# Patient Record
Sex: Female | Born: 1950 | Race: White | Hispanic: No | Marital: Married | Smoking: Former smoker
Health system: Southern US, Community
[De-identification: ages and names within clinical notes are randomized; demographics above are authoritative.]

## PROBLEM LIST (undated history)

## (undated) DIAGNOSIS — K429 Umbilical hernia without obstruction or gangrene: Secondary | ICD-10-CM

## (undated) DIAGNOSIS — I10 Essential (primary) hypertension: Secondary | ICD-10-CM

## (undated) DIAGNOSIS — E785 Hyperlipidemia, unspecified: Secondary | ICD-10-CM

## (undated) DIAGNOSIS — H6690 Otitis media, unspecified, unspecified ear: Secondary | ICD-10-CM

## (undated) DIAGNOSIS — O009 Unspecified ectopic pregnancy without intrauterine pregnancy: Secondary | ICD-10-CM

## (undated) DIAGNOSIS — F32A Depression, unspecified: Secondary | ICD-10-CM

## (undated) DIAGNOSIS — F329 Major depressive disorder, single episode, unspecified: Secondary | ICD-10-CM

## (undated) DIAGNOSIS — Z9289 Personal history of other medical treatment: Secondary | ICD-10-CM

## (undated) DIAGNOSIS — M1990 Unspecified osteoarthritis, unspecified site: Secondary | ICD-10-CM

## (undated) DIAGNOSIS — R079 Chest pain, unspecified: Secondary | ICD-10-CM

## (undated) DIAGNOSIS — R001 Bradycardia, unspecified: Secondary | ICD-10-CM

## (undated) DIAGNOSIS — F419 Anxiety disorder, unspecified: Secondary | ICD-10-CM

## (undated) DIAGNOSIS — R42 Dizziness and giddiness: Secondary | ICD-10-CM

## (undated) DIAGNOSIS — R0600 Dyspnea, unspecified: Secondary | ICD-10-CM

## (undated) HISTORY — PX: ABDOMINAL HYSTERECTOMY: SHX81

## (undated) HISTORY — DX: Chest pain, unspecified: R07.9

## (undated) HISTORY — PX: BREAST SURGERY: SHX581

## (undated) HISTORY — DX: Dizziness and giddiness: R42

## (undated) HISTORY — PX: AUGMENTATION MAMMAPLASTY: SUR837

## (undated) HISTORY — DX: Bradycardia, unspecified: R00.1

## (undated) HISTORY — DX: Hyperlipidemia, unspecified: E78.5

## (undated) HISTORY — DX: Essential (primary) hypertension: I10

---

## 1979-04-05 DIAGNOSIS — Z9289 Personal history of other medical treatment: Secondary | ICD-10-CM

## 1979-04-05 HISTORY — DX: Personal history of other medical treatment: Z92.89

## 1999-01-06 ENCOUNTER — Other Ambulatory Visit: Admission: RE | Admit: 1999-01-06 | Discharge: 1999-01-06 | Payer: Self-pay | Admitting: Obstetrics and Gynecology

## 1999-01-29 ENCOUNTER — Encounter: Payer: Self-pay | Admitting: Internal Medicine

## 1999-01-29 ENCOUNTER — Ambulatory Visit (HOSPITAL_COMMUNITY): Admission: RE | Admit: 1999-01-29 | Discharge: 1999-01-29 | Payer: Self-pay | Admitting: Internal Medicine

## 2000-02-01 ENCOUNTER — Ambulatory Visit (HOSPITAL_COMMUNITY): Admission: RE | Admit: 2000-02-01 | Discharge: 2000-02-01 | Payer: Self-pay | Admitting: Internal Medicine

## 2000-02-01 ENCOUNTER — Encounter: Payer: Self-pay | Admitting: Internal Medicine

## 2000-02-02 ENCOUNTER — Other Ambulatory Visit: Admission: RE | Admit: 2000-02-02 | Discharge: 2000-02-02 | Payer: Self-pay | Admitting: Obstetrics and Gynecology

## 2000-04-04 HISTORY — PX: CHOLECYSTECTOMY: SHX55

## 2001-02-01 ENCOUNTER — Ambulatory Visit (HOSPITAL_COMMUNITY): Admission: RE | Admit: 2001-02-01 | Discharge: 2001-02-01 | Payer: Self-pay | Admitting: Internal Medicine

## 2001-02-01 ENCOUNTER — Encounter: Payer: Self-pay | Admitting: Internal Medicine

## 2001-02-09 ENCOUNTER — Encounter: Payer: Self-pay | Admitting: Internal Medicine

## 2001-02-09 ENCOUNTER — Encounter: Admission: RE | Admit: 2001-02-09 | Discharge: 2001-02-09 | Payer: Self-pay | Admitting: Internal Medicine

## 2001-07-19 ENCOUNTER — Other Ambulatory Visit: Admission: RE | Admit: 2001-07-19 | Discharge: 2001-07-19 | Payer: Self-pay | Admitting: Obstetrics and Gynecology

## 2002-02-12 ENCOUNTER — Ambulatory Visit (HOSPITAL_COMMUNITY): Admission: RE | Admit: 2002-02-12 | Discharge: 2002-02-12 | Payer: Self-pay | Admitting: Internal Medicine

## 2002-02-12 ENCOUNTER — Encounter: Payer: Self-pay | Admitting: Internal Medicine

## 2002-05-10 ENCOUNTER — Other Ambulatory Visit (HOSPITAL_COMMUNITY): Admission: RE | Admit: 2002-05-10 | Discharge: 2002-05-28 | Payer: Self-pay | Admitting: Psychiatry

## 2002-10-18 ENCOUNTER — Other Ambulatory Visit: Admission: RE | Admit: 2002-10-18 | Discharge: 2002-10-18 | Payer: Self-pay | Admitting: Obstetrics and Gynecology

## 2002-11-03 HISTORY — PX: OTHER SURGICAL HISTORY: SHX169

## 2002-11-03 HISTORY — PX: UPPER GASTROINTESTINAL ENDOSCOPY: SHX188

## 2002-11-25 ENCOUNTER — Encounter (INDEPENDENT_AMBULATORY_CARE_PROVIDER_SITE_OTHER): Payer: Self-pay | Admitting: *Deleted

## 2002-11-25 ENCOUNTER — Ambulatory Visit (HOSPITAL_COMMUNITY): Admission: RE | Admit: 2002-11-25 | Discharge: 2002-11-25 | Payer: Self-pay | Admitting: Gastroenterology

## 2003-07-08 ENCOUNTER — Encounter: Admission: RE | Admit: 2003-07-08 | Discharge: 2003-07-08 | Payer: Self-pay | Admitting: Internal Medicine

## 2004-07-08 ENCOUNTER — Encounter: Admission: RE | Admit: 2004-07-08 | Discharge: 2004-07-08 | Payer: Self-pay | Admitting: Internal Medicine

## 2004-07-28 ENCOUNTER — Encounter: Admission: RE | Admit: 2004-07-28 | Discharge: 2004-07-28 | Payer: Self-pay | Admitting: Internal Medicine

## 2004-08-02 HISTORY — PX: HEMORRHOID SURGERY: SHX153

## 2004-08-05 ENCOUNTER — Ambulatory Visit (HOSPITAL_COMMUNITY): Admission: RE | Admit: 2004-08-05 | Discharge: 2004-08-05 | Payer: Self-pay | Admitting: General Surgery

## 2004-08-05 ENCOUNTER — Ambulatory Visit (HOSPITAL_BASED_OUTPATIENT_CLINIC_OR_DEPARTMENT_OTHER): Admission: RE | Admit: 2004-08-05 | Discharge: 2004-08-05 | Payer: Self-pay | Admitting: General Surgery

## 2004-08-06 ENCOUNTER — Encounter (INDEPENDENT_AMBULATORY_CARE_PROVIDER_SITE_OTHER): Payer: Self-pay | Admitting: Specialist

## 2004-11-02 ENCOUNTER — Ambulatory Visit (HOSPITAL_COMMUNITY): Admission: RE | Admit: 2004-11-02 | Discharge: 2004-11-02 | Payer: Self-pay | Admitting: Obstetrics and Gynecology

## 2004-11-03 ENCOUNTER — Emergency Department (HOSPITAL_COMMUNITY): Admission: EM | Admit: 2004-11-03 | Discharge: 2004-11-03 | Payer: Self-pay | Admitting: Family Medicine

## 2004-11-05 ENCOUNTER — Ambulatory Visit: Payer: Self-pay | Admitting: Infectious Diseases

## 2004-11-19 ENCOUNTER — Ambulatory Visit: Payer: Self-pay | Admitting: Infectious Diseases

## 2005-02-07 ENCOUNTER — Encounter: Admission: RE | Admit: 2005-02-07 | Discharge: 2005-02-07 | Payer: Self-pay | Admitting: Internal Medicine

## 2005-10-10 ENCOUNTER — Encounter: Admission: RE | Admit: 2005-10-10 | Discharge: 2005-10-10 | Payer: Self-pay | Admitting: Internal Medicine

## 2006-03-06 ENCOUNTER — Emergency Department (HOSPITAL_COMMUNITY): Admission: EM | Admit: 2006-03-06 | Discharge: 2006-03-07 | Payer: Self-pay | Admitting: Emergency Medicine

## 2006-06-30 ENCOUNTER — Emergency Department (HOSPITAL_COMMUNITY): Admission: EM | Admit: 2006-06-30 | Discharge: 2006-07-01 | Payer: Self-pay | Admitting: Emergency Medicine

## 2006-07-01 ENCOUNTER — Ambulatory Visit: Payer: Self-pay | Admitting: Psychiatry

## 2006-07-01 ENCOUNTER — Inpatient Hospital Stay (HOSPITAL_COMMUNITY): Admission: EM | Admit: 2006-07-01 | Discharge: 2006-07-04 | Payer: Self-pay | Admitting: Psychiatry

## 2006-09-05 ENCOUNTER — Emergency Department (HOSPITAL_COMMUNITY): Admission: EM | Admit: 2006-09-05 | Discharge: 2006-09-06 | Payer: Self-pay | Admitting: Emergency Medicine

## 2006-11-29 ENCOUNTER — Encounter: Admission: RE | Admit: 2006-11-29 | Discharge: 2006-11-29 | Payer: Self-pay | Admitting: Obstetrics and Gynecology

## 2007-04-12 ENCOUNTER — Emergency Department (HOSPITAL_COMMUNITY): Admission: EM | Admit: 2007-04-12 | Discharge: 2007-04-12 | Payer: Self-pay | Admitting: Emergency Medicine

## 2007-06-20 ENCOUNTER — Emergency Department (HOSPITAL_COMMUNITY): Admission: EM | Admit: 2007-06-20 | Discharge: 2007-06-20 | Payer: Self-pay | Admitting: Emergency Medicine

## 2007-06-21 ENCOUNTER — Ambulatory Visit: Payer: Self-pay | Admitting: Psychiatry

## 2007-06-21 ENCOUNTER — Inpatient Hospital Stay (HOSPITAL_COMMUNITY): Admission: EM | Admit: 2007-06-21 | Discharge: 2007-06-26 | Payer: Self-pay | Admitting: Psychiatry

## 2007-09-03 HISTORY — PX: DILATION AND CURETTAGE OF UTERUS: SHX78

## 2007-09-27 ENCOUNTER — Encounter (INDEPENDENT_AMBULATORY_CARE_PROVIDER_SITE_OTHER): Payer: Self-pay | Admitting: Obstetrics and Gynecology

## 2007-09-27 ENCOUNTER — Ambulatory Visit (HOSPITAL_COMMUNITY): Admission: RE | Admit: 2007-09-27 | Discharge: 2007-09-27 | Payer: Self-pay | Admitting: Obstetrics and Gynecology

## 2007-10-03 ENCOUNTER — Emergency Department (HOSPITAL_COMMUNITY): Admission: EM | Admit: 2007-10-03 | Discharge: 2007-10-03 | Payer: Self-pay | Admitting: Emergency Medicine

## 2008-02-08 ENCOUNTER — Emergency Department (HOSPITAL_COMMUNITY): Admission: EM | Admit: 2008-02-08 | Discharge: 2008-02-08 | Payer: Self-pay | Admitting: Emergency Medicine

## 2008-02-27 ENCOUNTER — Ambulatory Visit (HOSPITAL_COMMUNITY): Admission: RE | Admit: 2008-02-27 | Discharge: 2008-02-27 | Payer: Self-pay | Admitting: Obstetrics and Gynecology

## 2008-09-10 ENCOUNTER — Emergency Department (HOSPITAL_BASED_OUTPATIENT_CLINIC_OR_DEPARTMENT_OTHER): Admission: EM | Admit: 2008-09-10 | Discharge: 2008-09-10 | Payer: Self-pay | Admitting: Emergency Medicine

## 2009-04-29 ENCOUNTER — Ambulatory Visit (HOSPITAL_COMMUNITY): Admission: RE | Admit: 2009-04-29 | Discharge: 2009-04-29 | Payer: Self-pay | Admitting: Obstetrics and Gynecology

## 2010-01-17 ENCOUNTER — Ambulatory Visit: Payer: Self-pay | Admitting: Psychiatry

## 2010-01-17 ENCOUNTER — Emergency Department (HOSPITAL_COMMUNITY)
Admission: EM | Admit: 2010-01-17 | Discharge: 2010-01-17 | Disposition: A | Discharge status: Other Acute Inpatient Hospital | Payer: Self-pay | Source: Home / Self Care | Admitting: Emergency Medicine

## 2010-01-17 ENCOUNTER — Inpatient Hospital Stay (HOSPITAL_COMMUNITY): Admission: RE | Admit: 2010-01-17 | Discharge: 2010-01-21 | Payer: Self-pay | Admitting: Psychiatry

## 2010-01-25 ENCOUNTER — Other Ambulatory Visit (HOSPITAL_COMMUNITY)
Admission: RE | Admit: 2010-01-25 | Discharge: 2010-03-18 | Payer: Self-pay | Source: Home / Self Care | Attending: Psychiatry | Admitting: Psychiatry

## 2010-02-24 ENCOUNTER — Ambulatory Visit: Payer: Self-pay | Admitting: Psychiatry

## 2010-03-05 ENCOUNTER — Emergency Department (HOSPITAL_COMMUNITY)
Admission: EM | Admit: 2010-03-05 | Discharge: 2010-03-06 | Payer: Self-pay | Source: Home / Self Care | Admitting: Emergency Medicine

## 2010-04-24 ENCOUNTER — Encounter: Payer: Self-pay | Admitting: Obstetrics and Gynecology

## 2010-04-25 ENCOUNTER — Encounter: Payer: Self-pay | Admitting: Internal Medicine

## 2010-04-25 ENCOUNTER — Encounter: Payer: Self-pay | Admitting: Obstetrics and Gynecology

## 2010-06-14 LAB — CBC
HCT: 39.6 % (ref 36.0–46.0)
Hemoglobin: 13.8 g/dL (ref 12.0–15.0)
MCH: 30.5 pg (ref 26.0–34.0)
MCHC: 34.8 g/dL (ref 30.0–36.0)
MCV: 87.4 fL (ref 78.0–100.0)
Platelets: 303 10*3/uL (ref 150–400)
RBC: 4.53 MIL/uL (ref 3.87–5.11)
RDW: 13.8 % (ref 11.5–15.5)
WBC: 5.8 10*3/uL (ref 4.0–10.5)

## 2010-06-14 LAB — GLUCOSE, CAPILLARY
Glucose-Capillary: 61 mg/dL — ABNORMAL LOW (ref 70–99)
Glucose-Capillary: 84 mg/dL (ref 70–99)

## 2010-06-14 LAB — BASIC METABOLIC PANEL
BUN: 8 mg/dL (ref 6–23)
CO2: 27 mEq/L (ref 19–32)
Calcium: 9.6 mg/dL (ref 8.4–10.5)
Chloride: 103 mEq/L (ref 96–112)
Creatinine, Ser: 0.65 mg/dL (ref 0.4–1.2)
GFR calc Af Amer: >60 mL/min (ref >60)
GFR calc non Af Amer: >60 mL/min (ref >60)
Glucose, Bld: 80 mg/dL (ref 70–99)
Potassium: 3.8 mEq/L (ref 3.5–5.1)
Sodium: 139 mEq/L (ref 135–145)

## 2010-06-14 LAB — ETHANOL
Alcohol, Ethyl (B): 238 mg/dL — ABNORMAL HIGH (ref 0–10)
Alcohol, Ethyl (B): 341 mg/dL — ABNORMAL HIGH (ref 0–10)

## 2010-06-15 LAB — URINE DRUGS OF ABUSE SCREEN W ALC, ROUTINE (REF LAB)
Amphetamine Screen, Ur: NEGATIVE
Amphetamine Screen, Ur: NEGATIVE
Amphetamine Screen, Ur: NEGATIVE
Amphetamine Screen, Ur: NEGATIVE
Barbiturate Quant, Ur: NEGATIVE
Barbiturate Quant, Ur: NEGATIVE
Barbiturate Quant, Ur: NEGATIVE
Barbiturate Quant, Ur: NEGATIVE
Benzodiazepines.: NEGATIVE
Benzodiazepines.: NEGATIVE
Benzodiazepines.: NEGATIVE
Benzodiazepines.: NEGATIVE
Cocaine Metabolites: NEGATIVE
Cocaine Metabolites: NEGATIVE
Cocaine Metabolites: NEGATIVE
Cocaine Metabolites: NEGATIVE
Creatinine,U: 11.6 mg/dL
Creatinine,U: 11.8 mg/dL
Creatinine,U: 23.4 mg/dL
Creatinine,U: 25.1 mg/dL
Ethyl Alcohol: <10 mg/dL (ref <10)
Ethyl Alcohol: <10 mg/dL (ref <10)
Ethyl Alcohol: <10 mg/dL (ref <10)
Ethyl Alcohol: <10 mg/dL (ref <10)
Marijuana Metabolite: NEGATIVE
Marijuana Metabolite: NEGATIVE
Marijuana Metabolite: NEGATIVE
Marijuana Metabolite: NEGATIVE
Methadone: NEGATIVE
Methadone: NEGATIVE
Methadone: NEGATIVE
Methadone: NEGATIVE
Opiate Screen, Urine: NEGATIVE
Opiate Screen, Urine: NEGATIVE
Opiate Screen, Urine: NEGATIVE
Opiate Screen, Urine: NEGATIVE
Phencyclidine (PCP): NEGATIVE
Phencyclidine (PCP): NEGATIVE
Phencyclidine (PCP): NEGATIVE
Phencyclidine (PCP): NEGATIVE
Propoxyphene: NEGATIVE
Propoxyphene: NEGATIVE
Propoxyphene: NEGATIVE
Propoxyphene: NEGATIVE

## 2010-06-16 LAB — URINE DRUGS OF ABUSE SCREEN W ALC, ROUTINE (REF LAB)
Amphetamine Screen, Ur: NEGATIVE
Barbiturate Quant, Ur: NEGATIVE
Benzodiazepines.: POSITIVE — AB
Cocaine Metabolites: NEGATIVE
Creatinine,U: 65.4 mg/dL
Ethyl Alcohol: <10 mg/dL (ref <10)
Marijuana Metabolite: NEGATIVE
Methadone: NEGATIVE
Opiate Screen, Urine: NEGATIVE
Phencyclidine (PCP): NEGATIVE
Propoxyphene: NEGATIVE

## 2010-06-16 LAB — CBC
HCT: 40.2 % (ref 36.0–46.0)
Hemoglobin: 14 g/dL (ref 12.0–15.0)
MCH: 30.9 pg (ref 26.0–34.0)
MCHC: 34.7 g/dL (ref 30.0–36.0)
MCV: 89.1 fL (ref 78.0–100.0)
Platelets: 248 10*3/uL (ref 150–400)
RBC: 4.51 MIL/uL (ref 3.87–5.11)
RDW: 15.3 % (ref 11.5–15.5)
WBC: 7.1 10*3/uL (ref 4.0–10.5)

## 2010-06-16 LAB — COMPREHENSIVE METABOLIC PANEL
ALT: 19 U/L (ref 0–35)
AST: 37 U/L (ref 0–37)
Albumin: 4.2 g/dL (ref 3.5–5.2)
Alkaline Phosphatase: 63 U/L (ref 39–117)
BUN: 6 mg/dL (ref 6–23)
CO2: 26 mEq/L (ref 19–32)
Calcium: 8.6 mg/dL (ref 8.4–10.5)
Chloride: 96 mEq/L (ref 96–112)
Creatinine, Ser: 0.48 mg/dL (ref 0.4–1.2)
GFR calc Af Amer: >60 mL/min (ref >60)
GFR calc non Af Amer: >60 mL/min (ref >60)
Glucose, Bld: 109 mg/dL — ABNORMAL HIGH (ref 70–99)
Potassium: 3.3 mEq/L — ABNORMAL LOW (ref 3.5–5.1)
Sodium: 135 mEq/L (ref 135–145)
Total Bilirubin: 0.6 mg/dL (ref 0.3–1.2)
Total Protein: 7.7 g/dL (ref 6.0–8.3)

## 2010-06-16 LAB — RAPID URINE DRUG SCREEN, HOSP PERFORMED
Amphetamines: NOT DETECTED
Barbiturates: NOT DETECTED
Benzodiazepines: NOT DETECTED
Cocaine: NOT DETECTED
Opiates: NOT DETECTED
Tetrahydrocannabinol: NOT DETECTED

## 2010-06-16 LAB — DIFFERENTIAL
Basophils Absolute: 0 10*3/uL (ref 0.0–0.1)
Basophils Relative: 1 % (ref 0–1)
Eosinophils Absolute: 0 10*3/uL (ref 0.0–0.7)
Eosinophils Relative: 0 % (ref 0–5)
Lymphocytes Relative: 13 % (ref 12–46)
Lymphs Abs: 0.9 10*3/uL (ref 0.7–4.0)
Monocytes Absolute: 0.9 10*3/uL (ref 0.1–1.0)
Monocytes Relative: 12 % (ref 3–12)
Neutro Abs: 5.2 10*3/uL (ref 1.7–7.7)
Neutrophils Relative %: 74 % (ref 43–77)

## 2010-06-16 LAB — BENZODIAZEPINE, QUANTITATIVE, URINE
Alprazolam (GC/LC/MS), ur confirm: NEGATIVE NG/ML
Flurazepam GC/MS Conf: NEGATIVE NG/ML
Lorazepam UR QT: NEGATIVE NG/ML
Nordiazepam GC/MS Conf: NEGATIVE NG/ML
Oxazepam GC/MS Conf: 584 NG/ML — ABNORMAL HIGH
Temazepam GC/MS Conf: NEGATIVE NG/ML

## 2010-06-16 LAB — ETHANOL: Alcohol, Ethyl (B): 149 mg/dL — ABNORMAL HIGH (ref 0–10)

## 2010-06-28 ENCOUNTER — Other Ambulatory Visit (HOSPITAL_COMMUNITY): Payer: Self-pay | Admitting: Family Medicine

## 2010-06-28 DIAGNOSIS — Z1231 Encounter for screening mammogram for malignant neoplasm of breast: Secondary | ICD-10-CM

## 2010-07-07 ENCOUNTER — Ambulatory Visit (HOSPITAL_COMMUNITY)
Admission: RE | Admit: 2010-07-07 | Discharge: 2010-07-07 | Disposition: A | Discharge status: Home / Self Care | Payer: BC Managed Care – PPO | Source: Ambulatory Visit | Attending: Family Medicine | Admitting: Family Medicine

## 2010-07-07 DIAGNOSIS — Z1231 Encounter for screening mammogram for malignant neoplasm of breast: Secondary | ICD-10-CM | POA: Insufficient documentation

## 2010-07-12 LAB — URINE MICROSCOPIC-ADD ON

## 2010-07-12 LAB — POCT TOXICOLOGY PANEL
Benzodiazepines: POSITIVE
Opiates: POSITIVE

## 2010-07-12 LAB — DIFFERENTIAL
Basophils Absolute: 0.1 10*3/uL (ref 0.0–0.1)
Basophils Relative: 1 % (ref 0–1)
Eosinophils Absolute: 0.1 10*3/uL (ref 0.0–0.7)
Eosinophils Relative: 1 % (ref 0–5)
Lymphocytes Relative: 16 % (ref 12–46)
Lymphs Abs: 2.1 10*3/uL (ref 0.7–4.0)
Monocytes Absolute: 0.8 10*3/uL (ref 0.1–1.0)
Monocytes Relative: 6 % (ref 3–12)
Neutro Abs: 9.7 10*3/uL — ABNORMAL HIGH (ref 1.7–7.7)
Neutrophils Relative %: 76 % (ref 43–77)

## 2010-07-12 LAB — HEPATIC FUNCTION PANEL
ALT: 23 U/L (ref 0–35)
AST: 52 U/L — ABNORMAL HIGH (ref 0–37)
Albumin: 4.7 g/dL (ref 3.5–5.2)
Alkaline Phosphatase: 102 U/L (ref 39–117)
Bilirubin, Direct: 0 mg/dL (ref 0.0–0.3)
Indirect Bilirubin: 0.3 mg/dL (ref 0.3–0.9)
Total Bilirubin: 0.3 mg/dL (ref 0.3–1.2)
Total Protein: 8.4 g/dL — ABNORMAL HIGH (ref 6.0–8.3)

## 2010-07-12 LAB — CBC
HCT: 42.1 % (ref 36.0–46.0)
Hemoglobin: 14.1 g/dL (ref 12.0–15.0)
MCHC: 33.5 g/dL (ref 30.0–36.0)
MCV: 88.3 fL (ref 78.0–100.0)
Platelets: 300 10*3/uL (ref 150–400)
RBC: 4.77 MIL/uL (ref 3.87–5.11)
RDW: 14.6 % (ref 11.5–15.5)
WBC: 12.8 10*3/uL — ABNORMAL HIGH (ref 4.0–10.5)

## 2010-07-12 LAB — URINALYSIS, ROUTINE W REFLEX MICROSCOPIC
Bilirubin Urine: NEGATIVE
Glucose, UA: NEGATIVE mg/dL
Hgb urine dipstick: NEGATIVE
Ketones, ur: NEGATIVE mg/dL
Leukocytes, UA: NEGATIVE
Nitrite: NEGATIVE
Protein, ur: 30 mg/dL — AB
Specific Gravity, Urine: 1.015 (ref 1.005–1.030)
Urobilinogen, UA: 0.2 mg/dL (ref 0.0–1.0)
pH: 5 (ref 5.0–8.0)

## 2010-07-12 LAB — BASIC METABOLIC PANEL
BUN: 10 mg/dL (ref 6–23)
CO2: 29 mEq/L (ref 19–32)
Calcium: 8.6 mg/dL (ref 8.4–10.5)
Chloride: 101 mEq/L (ref 96–112)
Creatinine, Ser: 0.6 mg/dL (ref 0.4–1.2)
GFR calc Af Amer: >60 mL/min (ref >60)
GFR calc non Af Amer: >60 mL/min (ref >60)
Glucose, Bld: 70 mg/dL (ref 70–99)
Potassium: 4.1 mEq/L (ref 3.5–5.1)
Sodium: 143 mEq/L (ref 135–145)

## 2010-07-12 LAB — ACETAMINOPHEN LEVEL: Acetaminophen (Tylenol), Serum: <10 ug/mL — ABNORMAL LOW (ref 10–30)

## 2010-07-12 LAB — ETHANOL: Alcohol, Ethyl (B): 158 mg/dL — ABNORMAL HIGH (ref 0–10)

## 2010-07-27 ENCOUNTER — Ambulatory Visit: Payer: BLUE CROSS/BLUE SHIELD | Admitting: Psychiatry

## 2010-08-17 NOTE — Consult Note (Signed)
Amanda [name], Amanda [name] NO.:  [id number]   MEDICAL RECORD NO.:  [id number]          PATIENT TYPE:  EMS   LOCATION:  ED                           FACILITY:  [facility]   PHYSICIAN:  Erasmo Leventhal, M.D.DATE OF BIRTH:  13-Jul-1950   DATE OF CONSULTATION:  04/12/2007  DATE OF DISCHARGE:                                 CONSULTATION   I was asked to see the patient by Ramiro Harvest, M.D. of [facility].   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  She complains of a four-week history of atraumatic right  lateral hip pain which has increased over the past two weeks since  taking her kids on a trip.  Last night she fell and injured her right  foot.  She presents to the emergency room today complaining of right  lateral hip pain with some spasm in the right calf which has just  developed and right foot pain since the fall.  She was initially seen  and was contemplated admission to the hospital by Dr. Janee Morn.  When I  evaluated the patient she had received narcotics and also anti-  inflammatory medications.  She was awake, alert, oriented to person,  place, time, and circumstance.  Very comfortable and wondered if she  could go home.  She states that the right lateral hip had been bothering  her for several weeks and had simply gotten worse over the past two  weeks.  She has had no back pain, no numbness or tingling, no fever,  chills, or sweats.  The right foot has been bothering her since her  fall.  She developed a little bit of calf spasm when she first came in,  but that had gone away by now.   ALLERGIES:  CODEINE produces nausea.   PAST MEDICAL HISTORY:  Significant for prior suicide attempt on July 01, 2006, depressive NOS, ethanol dependence, hypertension, prolapsed  thrombosed hemorrhoids status post surgery by Anselm Pancoast. Zachery Dakins, M.D.  in May of 2006, chronic gastritis, history of superficial ulcer  in  antrum in 2004, no bleed or problem since then.  Status post  cholecystectomy in 2002.  Status post ruptured ectopic pregnancy.  Status post bilateral saline implant augmentation and hyperlipidemia.   MEDICATIONS:  1. Hydrochlorothiazide.  2. Aspirin.  3. Cymbalta.  4. Multivitamin.  5. Calcium.   SOCIAL HISTORY:  She is married for 34 years.  She is an Char[employer] fundraiser.  She is  unemployed.  She lives in [place].  Tobacco 4-5 per day and ethanol 3-  4 times per week.  No IV drug abuse.   FAMILY HISTORY:  Noncontributory.   REVIEW OF SYSTEMS:  MUSCULOSKELETAL:  Otherwise negative.   PHYSICAL EXAMINATION:  GENERAL:  Well-developed, well-nourished female  in no acute distress.  Very comfortable.  VITAL SIGNS:  Temperature 97.7.  EXTREMITIES:  Bilateral upper extremities unremarkable except for a  small amount of ecchymosis.  Lumbar spine unremarkable.  Lower  extremities; leg lengths are equal.  Right hip; full range of motion  actively and passively.  Pain with maximum adduction.  Point tenderness  of the greater trochanter reproducing her pain.  IT band is somewhat  tight.  Thigh, knee, calf unremarkable.  Compartments are soft.  Calf  not swollen and nontender, negative Homan's.  Unremarkable examination.  Right foot; mild swelling of the mid foot region.  Neurovascular  examination intact.  Compartments are soft.  No evidence of instability.  Ankle unremarkable.   Plain x-rays of the AP pelvis and hip negative.  Right knee negative.  Right foot; first metatarsal fracture, CT scan confirmed isolated  metatarsal fracture and no evidence of Lisfranc injury.   I evaluated the patient and felt that she had trochanteric bursitis.  I  discussed the risks and benefits of injection and she asked me to  proceed.   DESCRIPTION OF PROCEDURE:  The hip was cleansed with alcohol and I  injected the trochanteric bursa with 40 mg of Kenalog, 5 mL 1%  lidocaine, 5 mL 0.5% Marcaine without  complications or problems.  Following this, she had 100% resolution of her hip pain.   IMPRESSION:  1. Right lateral hip pain, trochanteric bursitis by history, physical      examination, and injection. Recommendations; Injection as above,      ice, Medrol Dosepak for six days.  2. Right foot first metatarsal isolated fracture.  Recommendations;      Ace wrap, Cam walker when out of bed, weightbearing as tolerated      with crutches as necessary.  Percocet and Robaxin was dispensed for      pain and for spasm.   The patient has been asking if she can go home.  I think that is  appropriate.  I discussed it with Dr. Janee Morn and he agrees.  I also  did this in the presence of a chaperone by her nurse, Sherlyn Lick.  With  that in mind the patient will be discharged home with instructions to  ice the hip and the foot, use crutches as necessary, and Cam walker when  out of bed.  She will take the medications as noted above.  She will be  seen back in my office in 1-2 weeks for follow-up.  All questions were  encouraged and answered in detail.           ______________________________  Erasmo Leventhal, M.D.     RAC/MEDQ  D:  04/12/2007  T:  04/13/2007  Job:  [id number]   cc:   Ramiro Harvest, MD
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
## 2010-08-17 NOTE — H&P (Signed)
NAMEASHLYN, Amanda [name] NO.:  [id number]   MEDICAL RECORD NO.:  [id number]          PATIENT TYPE:  EMS   LOCATION:  ED                           FACILITY:  [facility]   PHYSICIAN:  Ramiro Harvest, MD    DATE OF BIRTH:  11-20-1950   DATE OF ADMISSION:  DATE OF DISCHARGE:                              HISTORY & PHYSICAL   ADDENDUM   This is a addendum to job number [id number].   The patient was seen by orthopedics, Dr. Thomasena Edis, who assessed the  patient for a right foot fracture and right hip pain.  Felt the  patient's hip pain and muscle spasms likely secondary to a trochanteric  bursitis.  Dr. Thomasena Edis is going to go ahead and give the patient a  steroid injection here in the emergency room, place the patient on a  Medrol Dosepak and have the patient follow up with him as an outpatient  in  a week.  Foot fracture per ortho who he will place the patient in a  boot and have her follow up as a outpatient.  Feels that the patient  does want to go home and most of her symptoms are related to this  trochanteric bursitis which the patient will be managed as an outpatient  with Dr. Thomasena Edis and as such, the patient will be discharged home from  the emergency room     It  has been a pleasure taking care of Amanda [name].      Ramiro Harvest, MD  Electronically Signed     DT/MEDQ  D:  04/12/2007  T:  04/13/2007  Job:  [id number]   cc:   Dario Guardian, M.D.  Fax: [phone]

## 2010-08-17 NOTE — H&P (Signed)
Amanda, [name]                 ACCOUNT NO.:  [id number]   MEDICAL RECORD NO.:  [id number]          PATIENT TYPE:  INP   LOCATION:  0110                         FACILITY:  [facility]   PHYSICIAN:  Ramiro Harvest, MD    DATE OF BIRTH:  02-04-1951   DATE OF ADMISSION:  04/12/2007  DATE OF DISCHARGE:                              HISTORY & PHYSICAL   PRIMARY CARE PHYSICIAN:  Dario Guardian, M.D.   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  The patient stated that last night, one night  prior to admission, while she was coming down the stairs she heard a  popping sound and her leg gave away and she fell on the right side.  The  patient complained of right hip and right foot pain.  The patient also  complained of intermittent muscular spasms in the right lower extremity  from the right calf to the thigh that usually lasts seconds, is sharp in  nature.  No swelling or erythema.  No fever no chills.  No vomiting.  No  recent long flights.  No immobility.  The patient does state that has  had diarrhea for the past 2 weeks.  No recent antibiotics.  Good p.o.  intake.  No other associated symptoms.  In the ED the patient was given  morphine, Compazine, Valium, Dilaudid, Toradol some improvement per  patient.  Plain films of the right hip were negative.  Plain films of  the right foot showed a first base metatarsal fracture.  Chest x-ray  showed a new right upper lobe nodule, otherwise negative.  We were  called to admit the patient for further evaluation and management.   ALLERGIES:  CODEINE causes nausea.   PAST MEDICAL HISTORY:  1. A prior suicide attempt March 2008.  2. Depressive disorder.  3. Alcohol dependence.  4. Hypertension.  5. Prolapsed, thrombosed internal-external hemorrhoids, status post   hemorrhoidectomy per Dr. Zachery Dakins Aug 09, 2004.  6. Chronic gastritis.  7. History of superficial ulcer in the antrum per EGD of August 2004.  8. Status post cholecystectomy in 2002.  9. Status post ruptured ectopic pregnancy.  10.Bilateral saline implant augmentation.  11.Hyperlipidemia.   HOME MEDICATIONS:  1. Hydrochlorothiazide 25 mg p.o. daily.  2. Aspirin 81 mg p.o. daily.  3. Cymbalta 60 mg p.o. daily.  4. A multivitamin with calcium 600 mg daily.   SOCIAL HISTORY:  The patient has been married for 34 years.  Is a  Designer, jewellery, currently unemployed.  Lives in [place].  Positive  tobacco history.  Positive alcohol use.  No IV drug use.   FAMILY HISTORY:  Mother alive, age 54, with Crohn disease,  osteoarthritis.  Father deceased, age 23, with an acute MI.  The patient  has one brother and one sister with family history of hypertension and  hyperlipidemia.   REVIEW OF SYSTEMS:  Positive for diarrhea for the past  2 weeks and as  per HPI, otherwise negative.   VITAL SIGNS:  Temperature 97.7, BP 146/90, pulse of 92, respiratory rate  16, saturating 95% on room air.  GENERAL:  The patient is lying on a gurney with her husband by the  bedside, no apparent distress.  HEENT:  Normocephalic, atraumatic.  Pupils equal, round, reactive to  light.  Extraocular movements intact.  Oropharynx clear, moist, no  lesions.  NECK:  Supple.  No lymphadenopathy.  RESPIRATORY:  Lungs are clear to auscultation bilaterally.  No wheezes,  no rhonchi.  No crackles.  CARDIOVASCULAR:  Regular rate and rhythm.  No murmurs, rubs or gallops.  ABDOMEN:  Soft, nontender, nondistended.  Positive bowel sounds.  EXTREMITIES:  No clubbing, cyanosis or edema.  Negative Homans' sign.  Lower extremities nonerythematous, nonedematous.  Normal range of motion  of the right hip.  NEUROLOGICAL:  The patient is alert and oriented x3.  Cranial nerves II-  XII grossly intact.  No focal deficit.   LABS:   Magnesium 2.1.  Sodium 137, potassium 3.7, chloride 100, bicarb  25, BUN 7, creatinine 0.53, glucose 93, calcium 8.9.  CBC:  White count  10.6, hemoglobin 14.6, hematocrit 40.9, platelets 360.  Plain films of  the right foot:  Fracture at the base of first metatarsal.  Chest x-ray:  Cannot rule out a new right upper lobe nodule, recommend PA and lateral  chest x-ray when possible.  Right hip films  no acute findings.  Right  knee x-ray:  No acute abnormality.   ASSESSMENT AND PLAN:  Amanda [name] is a 60 year old female with history  of depression, hypertension and alcohol dependence in the past, who  presented with a 2-week history of worsening right hip and muscular  spasm in the right calf shooting to the right thigh.   1. Right hip pain, questionable etiology.  Plain films negative for      fracture.  The patient with normal range of motion.  We will check      an MRI of the right hip to rule out a fracture, make sure the      patient does not have osteonecrosis or other pathology causing this      right hip pain, and pain management.  2. Muscle spasm, questionable etiology.  Differential includes      electrolyte imbalance (however, the patient's potassium is normal,      Magnesium is normal, Calcium is normal) versus dehydration, The      patient with a 2-week history of diarrhea, versus a rhabdomyolysis,      versus deep vein thrombosis, which is unlikely with low pretest      probability.  We will check a total creatinine kinase, hydrate with      IV fluids, and follow.  May also be secondary to foot fracture.  3. Right first base metatarsal fracture per emergency department.      Emergency department physician states that he was called also for      consult.  Patient with a boot on.  Pain management, and will defer      further recommendations per orthopedics.  4. Diarrhea.  Check stool cultures for enteric pathogens, Escherichia      coli, Clostridium difficile toxin, fecal  leukocytes, IV fluid      hydration, and will follow.  5. Leukocytosis, likely secondary to problem #3.  We will follow.  No      need for antibiotics at this time/  6.  Hypertension.  Hydrochlorothiazide 25 mg daily.  7. Depression.  Cymbalta 60 mg daily.  8. Hyperlipidemia.  9. History of alcohol dependence.  Monitor for DTs.  10.Prophylaxis:  Protonix for GI, Lovenox for DVT prophylaxis.      Ramiro Harvest, MD  Electronically Signed     DT/MEDQ  D:  04/12/2007  T:  04/13/2007  Job:  [id number]   cc:   Dario Guardian, M.D.  Fax: [phone]

## 2010-08-17 NOTE — Op Note (Signed)
NAMECAROLANNE, Amanda [name]                 ACCOUNT NO.:  [id number]   MEDICAL RECORD NO.:  [id number]          PATIENT TYPE:  AMB   LOCATION:  SDC                           FACILITY:  [facility]   PHYSICIAN:  Maxie Better, M.D.DATE OF BIRTH:  1950-04-22   DATE OF PROCEDURE:  DATE OF DISCHARGE:                               OPERATIVE REPORT   PREOPERATIVE DIAGNOSES:  Postmenopausal bleeding, endometrial mass.   POSTOPERATIVE DIAGNOSIS:  Postmenopausal bleeding,  submucosal fibroid.   PROCEDURE:  Diagnostic hysteroscopy, hysteroscopic resection of  submucosal fibroid, and D&C.   ANESTHESIA:  MAC paracervical block.   SURGEON:  Maxie Better, MD   PROCEDURE:  Under adequate anesthesia, the patient was placed in the  dorsal lithotomy position.  She was sterilely prepped and draped in the  usual fashion.  She was catheterized for moderate amount of urine.  Examination under anesthesia revealed a normal-sized anteverted uterus.  No adnexal masses were appreciated.  Bivalve speculum was placed in the  vagina.  A 20 mL of 1% Nesacaine injected paracervical at 3 and 9  o'clock.  The anterior lip of the cervix was grasped with a single-tooth  tenaculum.  Cervix was dilated up to #23  [facility] dilator.  A sorbitol  primed diagnostic hysteroscope was introduced into the uterine cavity.  Both tubal ostia were seen well.  The endocervical canal was without any  lesions.  There was a submucosal fibroid anteriorly.  The hysteroscope  was removed.  The cavity was further dilated up to #27 [facility] dilator.  A  resectoscope was then inserted.  The fibroid was resected in total.  The  resectoscope removed.  The cavity was curetted for scant amount of  tissue.  The procedure was then felt to have been complete, at which  time all instruments were then removed from the vagina.  Specimen  labeled endometrial curetting and fibroid resection was sent to  pathology.  Estimated blood loss was minimal.   Complication was none.  The patient tolerated the procedure well and was transferred to recovery  room in stable condition.      Maxie Better, M.D.  Electronically Signed     [place]/MEDQ  D:  09/27/2007  T:  09/28/2007  Job:  [id number]

## 2010-08-18 ENCOUNTER — Ambulatory Visit: Payer: BLUE CROSS/BLUE SHIELD | Admitting: Psychiatry

## 2010-08-20 NOTE — Op Note (Signed)
   NAME:  Amanda [name], Amanda [name]                           ACCOUNT NO.:  [id number]   MEDICAL RECORD NO.:  [id number]                   PATIENT TYPE:  AMB   LOCATION:  ENDO                                 FACILITY:  [facility]   PHYSICIAN:  Anselmo Rod, M.D.               DATE OF BIRTH:  [birth year]   DATE OF PROCEDURE:  11/25/2002  DATE OF DISCHARGE:                                 OPERATIVE REPORT   PROCEDURE:  Colonoscopy.   ENDOSCOPIST:  Charna Elizabeth, M.D.   INSTRUMENT USED:  Olympus video colonoscope.   INDICATIONS FOR PROCEDURE:  Rectal bleeding in a 60 year old white female.  Rule out colonic polyps, masses, etc.   PREPROCEDURE PREPARATION:  Informed consent was procured from the patient.  The patient was fasted for eight hours prior to the procedure and prepped  with a bottle of magnesium citrate and a gallon of GOLYTELY the night prior  to the procedure.   PREPROCEDURE PHYSICAL EXAMINATION:  VITAL SIGNS:  The patient had stable  vital signs.  NECK:  Supple.  CHEST:  Clear to auscultation.  HEART:  S1 and S2 regular.  ABDOMEN:  Soft with normal bowel sounds.   DESCRIPTION OF PROCEDURE:  The patient was placed in the left lateral  decubitus position, sedated with 100 mg of Demerol and 10 mg of Versed  intravenously.  Once the patient was adequately sedated and maintained on  low flow oxygen and continuous cardiac monitoring, the Olympus video  colonoscope was advanced from the rectum to the cecum and terminal ileum  without difficulty.  The prepyloric orifice and ileocecal valve were clearly  visualized and photographed.  The terminal ileum appeared normal.  No  masses, polyps, erosions, ulcerations or diverticula were seen.  The patient  had prominent internal hemorrhoids and small external hemorrhoids.  I  suspect the patient's rectal bleeding is probably from the internal  hemorrhoids.  The patient tolerated the procedure well without  complications.   IMPRESSION:  Normal  colonoscopy up to the  terminal ileum except for  prominent internal hemorrhoids and small external hemorrhoids.    RECOMMENDATIONS:  1. Trial of Anusol suppositories, 2.5% one per rectum q.h.s. has been     advised.  2. High fiber diet with liberal fluid intake.  3. Proceed with an EGD at this time.                                               Anselmo Rod, M.D.    JNM/MEDQ  D:  11/25/2002  T:  11/26/2002  Job:  [id number]   cc:   Maxie Better, M.D.  [address]  [address]  [place]  [place] [address]  Fax: [phone]
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
## 2010-08-20 NOTE — Op Note (Signed)
[name], [name]                 ACCOUNT NO.:  [id number]   MEDICAL RECORD NO.:  [id number]          PATIENT TYPE:  AMB   LOCATION:  NESC                         FACILITY:  [facility]   PHYSICIAN:  Amanda [name], M.D.DATE OF BIRTH:  1950-08-05   DATE OF PROCEDURE:  08/05/2004  DATE OF DISCHARGE:                                 OPERATIVE REPORT   PREOPERATIVE DIAGNOSIS:  Prolapsed, thrombosed internal/external  hemorrhoids.   OPERATION:  Internal/external hemorrhoidectomy, general anesthesia,  lithotomy position.   SURGEON:  Amanda [name], M.D.   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  I saw her yesterday  afternoon, and she had a very large, prolapsed internal hemorrhoid complex  on the left with other thrombosed hemorrhoids circumferentially.  Reduced  the area with Xylocaine block and recommended that if she was not  significantly better today, that we could go ahead and proceed with an  urgent hemorrhoidectomy.  She said that she has had problems with  hemorrhoids ever since her second delivery, about 22 years ago, and has  never had a colonoscopy, I do not think.   On examination, she has prolapsed hemorrhoids.  Circumferentially, the  largest complex is on the left lateral side.  She was given 3 gm of Unasyn  and positioned on the OR table and placed up in a lithotomy position after  general endotracheal tube anesthesia.  The area was prepped with Betadine  surgical solution and draped in a sterile manner.  At first, I anesthetized  the sphincter area with about 20 cc of 0.5% Marcaine with Adrenaline and  then elected to go ahead and remove the right anterior quadrant first.  It  was the smaller of the three, elevating and removing the thrombus from the  external hemorrhoids and then elevating the hemorrhoid complex  with exposure  of the sphincter and then under-clamping it with a Buie clamp.  Used a  couple of U stitches of 2-0 chromic and then sutured over the Buie clamp and  then pulled the sutures snug and then closed the external portion of the  hemorrhoidectomy incision.  Next, the right posterolateral quadrant, which  was the second largest, was removed, and then I removed also some little  hemorrhoids in the inferior anal area by elevating the skin and mucosa.  I  closed this with a similar manner of about two U stitches of 2-0 chromic,  and I did place a couple of figure-of-eights of 3-0 chromic for a little  hemostasis in the mid portion of the incision.  Next, the largest in the  prolapsed, thrombosed area was then elevated from the left lateral position,  and the hemorrhoid complex was under-clamped with a Buie.  The sphincter was  under direct vision and protected, and then the couple of U stitches of 2-0  chromic and then suturing over this clamp, removing the clamp and then  closing the external components.  I  left a little bit of externally  thrombosed, superficial hemorrhoids in the left anterior area, but I elected  not to open this up, I do not want to make a fourth incision.  The procedure  was tolerated nicely.  I inspected all suture lines with an anoscope and put  Nupercaine in the anal canal with a gauze kind of within the anal canal for  hemostasis immediately postoperatively.  She will be released after a  short stay in the recovery room and hopes to be back to work in  approximately a week.  Hopefully, she will not have too much pain and will  start soaking in the bathtub and passing her urine, hopefully shortly after  the actual anesthetic agent has been absorbed.      WJW/MEDQ  D:  08/05/2004  T:  08/05/2004  Job:  [id number]

## 2010-08-20 NOTE — Op Note (Signed)
NAME:  Amanda [name], Amanda [name]                           ACCOUNT NO.:  [id number]   MEDICAL RECORD NO.:  [id number]                   PATIENT TYPE:  AMB   LOCATION:  ENDO                                 FACILITY:  [facility]   PHYSICIAN:  Anselmo Rod, M.D.               DATE OF BIRTH:  Nov 02, 1950   DATE OF PROCEDURE:  11/25/2002  DATE OF DISCHARGE:                                 OPERATIVE REPORT   PROCEDURE PERFORMED:  Esophagogastroduodenoscopy.   ENDOSCOPIST:  Charna Elizabeth, M.D.   INSTRUMENT USED:  Olympus video panendoscope.   INDICATIONS FOR PROCEDURE:  History of reflux, regurgitation of foods and  epigastric pain in a 60 year old white female rule out peptic ulcer disease,  esophagitis, gastritis, etc.   PREPROCEDURE PREPARATION:  Informed consent was procured from the patient.  The patient was fasted for eight hours prior to the procedure.   PREPROCEDURE PHYSICAL:  The patient had stable vital signs.  Neck supple,  chest clear to auscultation.  S1, S2 regular.  Abdomen soft with normal  bowel sounds.   DESCRIPTION OF PROCEDURE:  The patient was placed in the left lateral  decubitus position and sedated with Demerol and Versed for the colonoscopy.  She received additional sedation for the EGD.  Once the patient was  adequately sedated and maintained on low-flow oxygen and continuous cardiac  monitoring, the Olympus video panendoscope was advanced through the mouth  piece over the tongue into the esophagus under direct vision.  The entire  esophagus appeared normal with no evidence of ring, stricture, masses,  esophagitis or Barrett's mucosa.  The scope was then advanced to the  stomach.  There was a superficial ulcer seen in the antrum. There was no  evidence of a visible vessel.  Biopsies were done to rule out presence of  Helicobacter pylori by pathology.  There was evidence of chronic gastritis  throughout the gastric mucosa.  No other ulcers, erosions or masses were  seen.   The duodenal bulb and the proximal small bowel distal to the bulb  appeared normal.  There was no outlet obstruction.  The patient tolerated  the procedure well without complications.   IMPRESSION:  1. Superificial ulcer in the antrum without visible vessel.  2. Chronic gastritis throughout the gastric mucosa.  3. Normal-appearing esophagus and proximal small bowel.    RECOMMENDATIONS:  1. Trial of proton pump inhibitor will be suggested.  2. Treat with antibiotics if Helicobacter pylori present.  3. Outpatient follow-up in the next two weeks for further recommendations.                                                Anselmo Rod, M.D.    JNM/MEDQ  D:  11/25/2002  T:  11/26/2002  Job:  [id number]   cc:   Maxie Better, M.D.  [address]  [address]  [place]  [place] [address]  Fax: [phone]
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
## 2010-08-20 NOTE — Discharge Summary (Signed)
Amanda [name], Amanda [name]                 ACCOUNT NO.:  [id number]   MEDICAL RECORD NO.:  [id number]          PATIENT TYPE:  IPS   LOCATION:  0302                          [facility]ACILITY:  [facility]   PHYSICIAN:  Anselm Jungling, MD  DATE OF BIRTH:  Jun 19, 1950   DATE OF ADMISSION:  07/01/2006  DATE OF DISCHARGE:  07/04/2006                               DISCHARGE SUMMARY   IDENTIFYING DATA/REASON FOR ADMISSION:  The patient is a 60 year old  woman who had attempted suicide with alcohol and pills.  She had been  briefly combative during her Emergency Room treatment.  She had come to  [place] on a regimen of Celexa and opiate analgesics.  Please refer to the  admission note for further details pertaining to the symptoms,  circumstances, and history that led to her hospitalization.  She was  given initial Axis I diagnoses of alcohol dependence, with impending  withdrawal, possible opiate dependence, depressive disorder NOS, and  rule out post traumatic stress disorder, chronic.   MEDICAL AND LABORATORY:  The patient was medically and physically  assessed by the psychiatric nurse practitioner after having been  medically cleared in the Emergency Department.  She was continued on  hydrochlorothiazide 25 mg daily for hypertension.  There were no  significant medical issues.   HOSPITAL COURSE:  The patient was admitted to the Adult Inpatient  Psychiatric Service.  She presented as a well-nourished, well-developed  woman who was alert, fully oriented, disheveled, tired, dysphoric,  depressed, and withdrawn.  There were no signs or symptoms of psychosis  or thought disorder.  She denied any active suicidal ideation.  She was  nontremulous.   She was placed on a Librium withdrawal protocol and involved in various  therapeutic groups and activities, including those geared towards 12-  step recovery.   On the second hospital day, the patient appeared to be in a better mood,  was relaxed, calm, nontremulous, and  reported that she had slept well.  Her appetite was good.  She was continued on the Librium detoxification  protocol.   On the third hospital day, the patient appeared further improved, and  there were no new problems.  We discussed her medication situation and  agreed to continue Celexa 20 mg daily.  She reported urges in cravings  to drink alcohol.  She agreed that alcoholics anonymous meetings were a  good antidote to this.   On the fourth hospital day, the patient looked well, was bright,  relaxed, and much better rested.  She discussed her desire to share her  addiction with her adult daughters.  She completed her Librium protocol.  We made efforts to schedule a family meeting with her adult daughters  for the following day, but the patient indicated that she was ready for  discharge and did not want to wait an additional day for that reason.  She was discharged and agreed to the following aftercare plan.   AFTERCARE:  The patient was to followup with Jorje Guild, physician's  assistant, in our outpatient division on July 06, 2006.  She was also to  followup at the [facility] Department of Psychiatry with an appointment on  July 09, 2006.   DISCHARGE MEDICATIONS:  1. Celexa 20 mg daily.  2. Hydrochlorothiazide 25 mg daily.   DISCHARGE DIAGNOSES:  AXIS I:  Alcohol dependence, early remission, and  major depressive disorder NOS.  AXIS II:  Deferred.  AXIS III:  History of hypertension.  AXIS IV:  Stressors severe.  AXIS V:  GAF on discharge 65.      Anselm Jungling, MD  Electronically Signed     SPB/MEDQ  D:  07/19/2006  T:  07/19/2006  Job:  [id number]

## 2010-12-22 LAB — CBC
HCT: 40.9
Hemoglobin: 14.6
MCHC: 35.8
MCV: 90.4
Platelets: 360
RBC: 4.52
RDW: 13.8
WBC: 10.6 — ABNORMAL HIGH

## 2010-12-22 LAB — URINALYSIS, ROUTINE W REFLEX MICROSCOPIC
Bilirubin Urine: NEGATIVE
Glucose, UA: NEGATIVE
Hgb urine dipstick: NEGATIVE
Ketones, ur: 15 — AB
Leukocytes, UA: NEGATIVE
Nitrite: NEGATIVE
Protein, ur: 30 — AB
Specific Gravity, Urine: 1.029
Urobilinogen, UA: 0.2
pH: 6

## 2010-12-22 LAB — PROTIME-INR
INR: 1
Prothrombin Time: 13.4

## 2010-12-22 LAB — HEPATIC FUNCTION PANEL
ALT: 20
AST: 25
Albumin: 3.5
Alkaline Phosphatase: 53
Bilirubin, Direct: 0.1
Indirect Bilirubin: 1 — ABNORMAL HIGH
Total Bilirubin: 1.1
Total Protein: 6.2

## 2010-12-22 LAB — BASIC METABOLIC PANEL
BUN: 7
CO2: 25
Calcium: 8.9
Chloride: 100
Creatinine, Ser: 0.53
GFR calc Af Amer: >60
GFR calc non Af Amer: >60
Glucose, Bld: 93
Potassium: 3.7
Sodium: 137

## 2010-12-22 LAB — URINE CULTURE: Colony Count: 25000

## 2010-12-22 LAB — URINE MICROSCOPIC-ADD ON

## 2010-12-22 LAB — MAGNESIUM: Magnesium: 2.1

## 2010-12-22 LAB — APTT: aPTT: 26

## 2010-12-27 LAB — URINE MICROSCOPIC-ADD ON

## 2010-12-27 LAB — HEPATIC FUNCTION PANEL
ALT: 27
AST: 36
Albumin: 4
Alkaline Phosphatase: 56
Bilirubin, Direct: <0.1
Total Bilirubin: 0.6
Total Protein: 7

## 2010-12-27 LAB — URINALYSIS, ROUTINE W REFLEX MICROSCOPIC
Bilirubin Urine: NEGATIVE
Glucose, UA: NEGATIVE
Hgb urine dipstick: NEGATIVE
Ketones, ur: NEGATIVE
Leukocytes, UA: NEGATIVE
Nitrite: NEGATIVE
Protein, ur: 30 — AB
Specific Gravity, Urine: 1.017
Urobilinogen, UA: 0.2
pH: 5.5

## 2010-12-27 LAB — BASIC METABOLIC PANEL
BUN: 8
CO2: 25
Calcium: 8.7
Chloride: 104
Creatinine, Ser: 0.51
GFR calc Af Amer: >60
GFR calc non Af Amer: >60
Glucose, Bld: 87
Potassium: 3.9
Sodium: 142

## 2010-12-27 LAB — DIFFERENTIAL
Basophils Absolute: 0
Basophils Relative: 1
Eosinophils Absolute: 0
Eosinophils Relative: 1
Lymphocytes Relative: 32
Lymphs Abs: 2.3
Monocytes Absolute: 0.4
Monocytes Relative: 6
Neutro Abs: 4.4
Neutrophils Relative %: 61

## 2010-12-27 LAB — ETHANOL
Alcohol, Ethyl (B): 173 — ABNORMAL HIGH
Alcohol, Ethyl (B): 236 — ABNORMAL HIGH
Alcohol, Ethyl (B): 345 — ABNORMAL HIGH

## 2010-12-27 LAB — TSH: TSH: 1.543

## 2010-12-27 LAB — CBC
HCT: 41.8
Hemoglobin: 14.4
MCHC: 34.3
MCV: 92.4
Platelets: 349
RBC: 4.53
RDW: 14.6
WBC: 7.2

## 2010-12-27 LAB — RPR: RPR Ser Ql: NONREACTIVE

## 2010-12-27 LAB — RAPID URINE DRUG SCREEN, HOSP PERFORMED
Amphetamines: NOT DETECTED
Barbiturates: NOT DETECTED
Benzodiazepines: NOT DETECTED
Cocaine: NOT DETECTED
Opiates: NOT DETECTED
Tetrahydrocannabinol: NOT DETECTED

## 2010-12-27 LAB — VITAMIN B12: Vitamin B-12: 313 (ref 211–911)

## 2010-12-27 LAB — ACETAMINOPHEN LEVEL: Acetaminophen (Tylenol), Serum: <10 — ABNORMAL LOW

## 2010-12-30 LAB — RAPID URINE DRUG SCREEN, HOSP PERFORMED
Amphetamines: NOT DETECTED
Barbiturates: NOT DETECTED
Benzodiazepines: NOT DETECTED
Cocaine: NOT DETECTED
Opiates: NOT DETECTED
Tetrahydrocannabinol: NOT DETECTED

## 2010-12-30 LAB — BASIC METABOLIC PANEL
BUN: 8
CO2: 31
Calcium: 9.5
Chloride: 99
Creatinine, Ser: 0.52
GFR calc Af Amer: >60
GFR calc non Af Amer: >60
Glucose, Bld: 97
Potassium: 3.6
Sodium: 136

## 2010-12-30 LAB — POCT I-STAT, CHEM 8
BUN: 6
Calcium, Ion: 0.98 — ABNORMAL LOW
Chloride: 104
Creatinine, Ser: 0.8
Glucose, Bld: 93
HCT: 39
Hemoglobin: 13.3
Potassium: 3.6
Sodium: 138
TCO2: 23

## 2010-12-30 LAB — CBC
HCT: 41.4
Hemoglobin: 14.3
MCHC: 34.5
MCV: 89.3
Platelets: 270
RBC: 4.63
RDW: 13.9
WBC: 7.3

## 2010-12-30 LAB — ETHANOL: Alcohol, Ethyl (B): 221 — ABNORMAL HIGH

## 2010-12-30 LAB — SALICYLATE LEVEL: Salicylate Lvl: <4

## 2010-12-30 LAB — ACETAMINOPHEN LEVEL: Acetaminophen (Tylenol), Serum: <10 — ABNORMAL LOW

## 2011-01-18 ENCOUNTER — Emergency Department (HOSPITAL_COMMUNITY)
Admission: EM | Admit: 2011-01-18 | Discharge: 2011-01-19 | Disposition: A | Discharge status: Other Acute Inpatient Hospital | Payer: BC Managed Care – PPO | Attending: Emergency Medicine | Admitting: Emergency Medicine

## 2011-01-18 DIAGNOSIS — R45851 Suicidal ideations: Secondary | ICD-10-CM | POA: Insufficient documentation

## 2011-01-18 DIAGNOSIS — F1020 Alcohol dependence, uncomplicated: Secondary | ICD-10-CM | POA: Insufficient documentation

## 2011-01-19 ENCOUNTER — Inpatient Hospital Stay (HOSPITAL_COMMUNITY)
Admission: AD | Admit: 2011-01-19 | Discharge: 2011-01-24 | DRG: 751 | Disposition: A | Discharge status: Home / Self Care | Payer: BC Managed Care – PPO | Source: Ambulatory Visit | Attending: Psychiatry | Admitting: Psychiatry

## 2011-01-19 DIAGNOSIS — F319 Bipolar disorder, unspecified: Secondary | ICD-10-CM

## 2011-01-19 DIAGNOSIS — F1020 Alcohol dependence, uncomplicated: Principal | ICD-10-CM

## 2011-01-19 DIAGNOSIS — F411 Generalized anxiety disorder: Secondary | ICD-10-CM

## 2011-01-19 DIAGNOSIS — Z818 Family history of other mental and behavioral disorders: Secondary | ICD-10-CM

## 2011-01-19 DIAGNOSIS — E785 Hyperlipidemia, unspecified: Secondary | ICD-10-CM

## 2011-01-19 DIAGNOSIS — I10 Essential (primary) hypertension: Secondary | ICD-10-CM

## 2011-01-19 LAB — DIFFERENTIAL
Basophils Absolute: 0.1 10*3/uL (ref 0.0–0.1)
Basophils Relative: 1 % (ref 0–1)
Eosinophils Absolute: 0.1 10*3/uL (ref 0.0–0.7)
Eosinophils Relative: 2 % (ref 0–5)
Lymphocytes Relative: 44 % (ref 12–46)
Lymphs Abs: 3.1 10*3/uL (ref 0.7–4.0)
Monocytes Absolute: 0.8 10*3/uL (ref 0.1–1.0)
Monocytes Relative: 12 % (ref 3–12)
Neutro Abs: 3 10*3/uL (ref 1.7–7.7)
Neutrophils Relative %: 42 % — ABNORMAL LOW (ref 43–77)

## 2011-01-19 LAB — ACETAMINOPHEN LEVEL: Acetaminophen (Tylenol), Serum: <15 ug/mL (ref 10–30)

## 2011-01-19 LAB — POCT I-STAT, CHEM 8
BUN: 9 mg/dL (ref 6–23)
Calcium, Ion: 1.02 mmol/L — ABNORMAL LOW (ref 1.12–1.32)
Chloride: 111 mEq/L (ref 96–112)
Creatinine, Ser: 1.2 mg/dL — ABNORMAL HIGH (ref 0.50–1.10)
Glucose, Bld: 92 mg/dL (ref 70–99)
HCT: 39 % (ref 36.0–46.0)
Hemoglobin: 13.3 g/dL (ref 12.0–15.0)
Potassium: 4 mEq/L (ref 3.5–5.1)
Sodium: 146 mEq/L — ABNORMAL HIGH (ref 135–145)
TCO2: 22 mmol/L (ref 0–100)

## 2011-01-19 LAB — URINALYSIS, ROUTINE W REFLEX MICROSCOPIC
Bilirubin Urine: NEGATIVE
Glucose, UA: NEGATIVE mg/dL
Hgb urine dipstick: NEGATIVE
Ketones, ur: NEGATIVE mg/dL
Leukocytes, UA: NEGATIVE
Nitrite: NEGATIVE
Protein, ur: NEGATIVE mg/dL
Specific Gravity, Urine: 1.005 (ref 1.005–1.030)
Urobilinogen, UA: 0.2 mg/dL (ref 0.0–1.0)
pH: 5.5 (ref 5.0–8.0)

## 2011-01-19 LAB — CBC
HCT: 38.4 % (ref 36.0–46.0)
Hemoglobin: 12.8 g/dL (ref 12.0–15.0)
MCH: 30.2 pg (ref 26.0–34.0)
MCHC: 33.3 g/dL (ref 30.0–36.0)
MCV: 90.6 fL (ref 78.0–100.0)
Platelets: 297 10*3/uL (ref 150–400)
RBC: 4.24 MIL/uL (ref 3.87–5.11)
RDW: 14.8 % (ref 11.5–15.5)
WBC: 7.1 10*3/uL (ref 4.0–10.5)

## 2011-01-19 LAB — ETHANOL
Alcohol, Ethyl (B): 350 mg/dL — ABNORMAL HIGH (ref 0–11)
Alcohol, Ethyl (B): 93 mg/dL — ABNORMAL HIGH (ref 0–11)

## 2011-01-19 LAB — RAPID URINE DRUG SCREEN, HOSP PERFORMED
Amphetamines: NOT DETECTED
Barbiturates: NOT DETECTED
Benzodiazepines: NOT DETECTED
Cocaine: NOT DETECTED
Opiates: NOT DETECTED
Tetrahydrocannabinol: NOT DETECTED

## 2011-01-19 LAB — SALICYLATE LEVEL: Salicylate Lvl: <2 mg/dL — ABNORMAL LOW (ref 2.8–20.0)

## 2011-01-19 LAB — TRICYCLICS SCREEN, URINE: TCA Scrn: NOT DETECTED

## 2011-01-20 DIAGNOSIS — F1020 Alcohol dependence, uncomplicated: Secondary | ICD-10-CM
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
## 2011-02-08 ENCOUNTER — Emergency Department (HOSPITAL_COMMUNITY)
Admission: EM | Admit: 2011-02-08 | Discharge: 2011-02-09 | Disposition: A | Discharge status: Home / Self Care | Payer: BC Managed Care – PPO | Attending: Emergency Medicine | Admitting: Emergency Medicine

## 2011-02-08 DIAGNOSIS — F1010 Alcohol abuse, uncomplicated: Secondary | ICD-10-CM

## 2011-02-08 DIAGNOSIS — F10929 Alcohol use, unspecified with intoxication, unspecified: Secondary | ICD-10-CM

## 2011-02-08 DIAGNOSIS — F3289 Other specified depressive episodes: Secondary | ICD-10-CM | POA: Insufficient documentation

## 2011-02-08 DIAGNOSIS — I10 Essential (primary) hypertension: Secondary | ICD-10-CM | POA: Insufficient documentation

## 2011-02-08 DIAGNOSIS — F329 Major depressive disorder, single episode, unspecified: Secondary | ICD-10-CM | POA: Insufficient documentation

## 2011-02-08 HISTORY — DX: Essential (primary) hypertension: I10

## 2011-02-08 HISTORY — DX: Depression, unspecified: F32.A

## 2011-02-08 HISTORY — DX: Major depressive disorder, single episode, unspecified: F32.9

## 2011-02-08 LAB — RAPID URINE DRUG SCREEN, HOSP PERFORMED
Amphetamines: NOT DETECTED
Barbiturates: NOT DETECTED
Benzodiazepines: NOT DETECTED
Cocaine: NOT DETECTED
Opiates: NOT DETECTED
Tetrahydrocannabinol: NOT DETECTED

## 2011-02-08 LAB — COMPREHENSIVE METABOLIC PANEL
ALT: 25 U/L (ref 0–35)
AST: 29 U/L (ref 0–37)
Albumin: 4.1 g/dL (ref 3.5–5.2)
Alkaline Phosphatase: 63 U/L (ref 39–117)
BUN: 8 mg/dL (ref 6–23)
CO2: 29 mEq/L (ref 19–32)
Calcium: 9.4 mg/dL (ref 8.4–10.5)
Chloride: 100 mEq/L (ref 96–112)
Creatinine, Ser: 0.5 mg/dL (ref 0.50–1.10)
GFR calc Af Amer: >90 mL/min (ref >90)
GFR calc non Af Amer: >90 mL/min (ref >90)
Glucose, Bld: 88 mg/dL (ref 70–99)
Potassium: 3.9 mEq/L (ref 3.5–5.1)
Sodium: 140 mEq/L (ref 135–145)
Total Bilirubin: 0.1 mg/dL — ABNORMAL LOW (ref 0.3–1.2)
Total Protein: 8 g/dL (ref 6.0–8.3)

## 2011-02-08 LAB — CBC
HCT: 41.1 % (ref 36.0–46.0)
Hemoglobin: 14 g/dL (ref 12.0–15.0)
MCH: 30.5 pg (ref 26.0–34.0)
MCHC: 34.1 g/dL (ref 30.0–36.0)
MCV: 89.5 fL (ref 78.0–100.0)
Platelets: 303 10*3/uL (ref 150–400)
RBC: 4.59 MIL/uL (ref 3.87–5.11)
RDW: 14.7 % (ref 11.5–15.5)
WBC: 5.7 10*3/uL (ref 4.0–10.5)

## 2011-02-08 LAB — ETHANOL: Alcohol, Ethyl (B): 270 mg/dL — ABNORMAL HIGH (ref 0–11)

## 2011-02-08 MED ORDER — ACETAMINOPHEN 325 MG PO TABS: 650.0000 mg | ORAL_TABLET | ORAL | Status: DC | PRN

## 2011-02-08 MED ORDER — ONDANSETRON HCL 4 MG PO TABS: 4.0000 mg | ORAL_TABLET | Freq: Three times a day (TID) | ORAL | Status: DC | PRN

## 2011-02-08 MED ORDER — ZOLPIDEM TARTRATE 5 MG PO TABS: 5.0000 mg | ORAL_TABLET | Freq: Every evening | ORAL | Status: DC | PRN

## 2011-02-08 MED ORDER — IBUPROFEN 200 MG PO TABS: 600.0000 mg | ORAL_TABLET | Freq: Three times a day (TID) | ORAL | Status: DC | PRN

## 2011-02-08 MED ORDER — ALUM & MAG HYDROXIDE-SIMETH 200-200-20 MG/5ML PO SUSP: 30.0000 mL | ORAL | Status: DC | PRN

## 2011-02-08 NOTE — ED Notes (Signed)
UJW:[id number]<YN> Expected date:02/08/11<BR> Expected time: 1:51 PM<BR> Means of arrival:Ambulance<BR> Comments:<BR> M10 - 60yoF Suicidal, ETOH
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
## 2011-02-08 NOTE — ED Notes (Signed)
Pt brought by ems. Pt called her pastor and was verbalizing feelings of depression to him, and her pastor notified police stating that he did not feel that pt is safe to be left alone. Pt states she has hx of depression, (she is retired Insurance underwriter from

## 2011-02-09 NOTE — ED Notes (Signed)
patient calls spouse wanting him to come to hospital to be with her.  Rn talked to spouse at the request of the patient and he is pretty fed up with his wife's drinking problem.  He was told that she has expressed a desire to get sober once again.  Sts lost sobriety when her sponsor lost her and they got drunk together.  Patient told that her husband said that he would think about coming to the hospital

## 2011-02-09 NOTE — ED Notes (Signed)
Patient called spouse  To come to the hospital to get her

## 2011-02-09 NOTE — ED Provider Notes (Signed)
Act team has assessed, they indicate pt declines inpt detox or rehab eval. Pt also states that made stupid comments when intoxicated to effect that fam would be better off without, but states has no intention of harming self or others. Pt has normal mood/affect, is cooperative. Requests d/c. Does not appear acutely depression, no si, contracts for safety. Again offered inpt tx, incl rehab/detox, pt declines, requests d/c. After has given outpt referrals with which pt encouraged to follow up.   Suzi Roots, MD 02/09/11 [phone]

## 2011-02-09 NOTE — Progress Notes (Signed)
Assessment Note   Amanda [name] is an 60 y.o. female.  Amanda [name] came to [facility] by EMS.  She had come to the hospital after she had relapsed on ETOH.  Amanda [name] says that she had left [facility] after a 4 day stay in October.  She maintained sobriety for about 10 days.  Last night (Monday night the 5th) she and her sponsor from AA went out and drank.  She is now looking for a new sponsor.  Amanda [name] had gotten upset about her relapse and said that she talked to her minister and made a statement (when inebriated) about her husband being better off without her.  Amanda [name] denies any current SI or plan or intention.  She said that her children are starting families and she could not hurt them that way.  No current HI or A/V hallucinations.  Amanda [name] declined the offer to seek rehabilitation services or detox at this time.  She did take referral information on [facility] so that she can get in touch with a peer counselor named Molli Hazard there.  Dr. Denton Lank was informed of disposition and discharged [place] home with the referrals. Axis I: Alcohol Abuse Axis II: Deferred Axis III:  Past Medical History  Diagnosis Date  . Hypertension   . Depression    Axis IV: Problems with chronic substance abuse Axis V: 51-60 moderate symptoms  Past Medical History:  Past Medical History  Diagnosis Date  . Hypertension   . Depression     No past surgical history on file.  Family History: No family history on file.  Social History:  has an unknown smoking status. She does not have any smokeless tobacco history on file. She reports that she drinks alcohol. Her drug history not on file.  Allergies:  Allergies  Allergen Reactions  . Codeine Nausea Only  . Medrol (Methylprednisolone) Rash    Home Medications:  Medications Prior to Admission  Medication Dose Route Frequency Provider Last Rate Last Dose  . acetaminophen (TYLENOL) tablet 650 mg  650 mg Oral Q4H PRN Ethelda Chick, MD      . alum & mag hydroxide-simeth (MAALOX/MYLANTA)  200-200-20 MG/5ML suspension 30 mL  30 mL Oral PRN Ethelda Chick, MD      . ibuprofen (ADVIL,MOTRIN) tablet 600 mg  600 mg Oral Q8H PRN Ethelda Chick, MD      . ondansetron [facility]) tablet 4 mg  4 mg Oral Q8H PRN Ethelda Chick, MD      . zolpidem (AMBIEN) tablet 5 mg  5 mg Oral QHS PRN Ethelda Chick, MD       No current outpatient prescriptions on file as of 02/08/2011.    OB/GYN Status:  No LMP recorded. Patient is postmenopausal.  General Assessment Data Living Arrangements: Spouse/significant other Can pt return to current living arrangement?: Yes Admission Status: Voluntary Is patient capable of signing voluntary admission?: Yes Transfer from: Acute Hospital Referral Source: Self/Family/Friend  Risk to self Suicidal Ideation: No Suicidal Intent: No Is patient at risk for suicide?: No Suicidal Plan?: No Access to Means: No What has been your use of drugs/alcohol within the last 12 months?:  (Drinking ETOH) Other Self Harm Risks:  (None) Triggers for Past Attempts: None known Intentional Self Injurious Behavior: None Factors that decrease suicide risk: Sense of responsibility to family Family Suicide History: No Recent stressful life event(s): Conflict (Comment) Persecutory voices/beliefs?: No Depression: No Depression Symptoms:  (N/A) Substance abuse history and/or treatment for substance abuse?: Yes Suicide  prevention information given to non-admitted patients: Not applicable  Risk to Others Homicidal Ideation: No Thoughts of Harm to Others: No Current Homicidal Intent: No Current Homicidal Plan: No Access to Homicidal Means: No Identified Victim: No one History of harm to others?: No Assessment of Violence: None Noted Violent Behavior Description: None Does patient have access to weapons?: No Criminal Charges Pending?: No Does patient have a court date: No  Mental Status Report Appear/Hygiene:  (Casual) Eye Contact: Good Motor Activity:  Unremarkable Speech: Logical/coherent Level of Consciousness: Alert Mood:  (Appropriate) Affect: Appropriate to circumstance Anxiety Level: Moderate Thought Processes: Coherent;Relevant Judgement: Impaired Orientation: Person;Place;Time;Situation Obsessive Compulsive Thoughts/Behaviors: None  Cognitive Functioning Concentration: Normal Memory: Recent Intact;Remote Intact IQ: Average Insight: Good Impulse Control: Poor Appetite: Fair Weight Loss:  (N/A) Weight Gain:  (N/A) Sleep: Decreased Total Hours of Sleep:  (<6H/D) Vegetative Symptoms: None  Prior Inpatient/Outpatient Therapy Prior Therapy: Inpatient Prior Therapy Dates:  (Oct 2012) Prior Therapy Facilty/Provider(s): [facility] Reason for Treatment: Detox  ADL Screening (condition at time of admission) Patient's cognitive ability adequate to safely complete daily activities?: Yes Patient able to express need for assistance with ADLs?: Yes Independently performs ADLs?: Yes Weakness of Legs: None Weakness of Arms/Hands: None  Home Assistive Devices/Equipment Home Assistive Devices/Equipment: None    Abuse/Neglect Assessment (Assessment to be complete while patient is alone) Physical Abuse: Denies Verbal Abuse: Denies Sexual Abuse: Denies Exploitation of patient/patient's resources: Denies Self-Neglect: Denies          Additional Information 1:1 In Past 12 Months?: No CIRT Risk: No Elopement Risk: No Does patient have medical clearance?: Yes     Disposition:  Disposition Disposition of Patient: Outpatient treatment  On Site Evaluation by:   Reviewed with Physician:     Alexandria Lodge M.S. QP 02/09/2011 6:16 AM

## 2011-02-09 NOTE — ED Notes (Signed)
ACT team is finished with interview patient sts that she is going to be going home

## 2011-02-09 NOTE — ED Notes (Signed)
Patient awake up to BR to Void, sts feels constipated prune juice given to patient.  Patient sts feels fine except for a some sweating.  Still waiting for ACT to see her.  Saw ACT person and they are aware of the consult

## 2011-02-09 NOTE — ED Notes (Signed)
Patient resting quietly in room no signs of withdrawal noted.

## 2011-03-22 ENCOUNTER — Ambulatory Visit (HOSPITAL_COMMUNITY): Payer: BLUE CROSS/BLUE SHIELD | Admitting: Physician Assistant

## 2011-05-16 ENCOUNTER — Other Ambulatory Visit (HOSPITAL_COMMUNITY)
Admission: RE | Admit: 2011-05-16 | Discharge: 2011-05-16 | Disposition: A | Discharge status: Home / Self Care | Payer: BC Managed Care – PPO | Source: Ambulatory Visit | Attending: Obstetrics and Gynecology | Admitting: Obstetrics and Gynecology

## 2011-05-16 ENCOUNTER — Other Ambulatory Visit: Payer: Self-pay | Admitting: Obstetrics and Gynecology

## 2011-05-16 DIAGNOSIS — R87810 Cervical high risk human papillomavirus (HPV) DNA test positive: Secondary | ICD-10-CM | POA: Insufficient documentation

## 2011-05-16 DIAGNOSIS — Z01419 Encounter for gynecological examination (general) (routine) without abnormal findings: Secondary | ICD-10-CM | POA: Insufficient documentation

## 2011-06-15 ENCOUNTER — Other Ambulatory Visit: Payer: Self-pay | Admitting: Obstetrics and Gynecology

## 2011-07-04 ENCOUNTER — Other Ambulatory Visit: Payer: Self-pay | Admitting: Obstetrics and Gynecology

## 2011-08-02 ENCOUNTER — Other Ambulatory Visit (HOSPITAL_COMMUNITY): Payer: Self-pay | Admitting: Family Medicine

## 2011-08-02 DIAGNOSIS — Z1231 Encounter for screening mammogram for malignant neoplasm of breast: Secondary | ICD-10-CM

## 2011-08-21 ENCOUNTER — Other Ambulatory Visit: Payer: Self-pay | Admitting: Obstetrics and Gynecology

## 2011-08-31 ENCOUNTER — Ambulatory Visit (HOSPITAL_COMMUNITY)
Admission: RE | Admit: 2011-08-31 | Discharge: 2011-08-31 | Disposition: A | Discharge status: Home / Self Care | Payer: BC Managed Care – PPO | Source: Ambulatory Visit | Attending: Family Medicine | Admitting: Family Medicine

## 2011-08-31 DIAGNOSIS — Z1231 Encounter for screening mammogram for malignant neoplasm of breast: Secondary | ICD-10-CM | POA: Insufficient documentation

## 2011-10-03 ENCOUNTER — Encounter (HOSPITAL_COMMUNITY): Payer: Self-pay | Admitting: Pharmacist

## 2011-10-13 ENCOUNTER — Inpatient Hospital Stay (HOSPITAL_COMMUNITY): Admission: RE | Admit: 2011-10-13 | Payer: BC Managed Care – PPO | Source: Ambulatory Visit

## 2011-10-14 ENCOUNTER — Inpatient Hospital Stay (HOSPITAL_COMMUNITY): Admission: RE | Admit: 2011-10-14 | Discharge: 2011-10-14 | Payer: BC Managed Care – PPO | Source: Ambulatory Visit

## 2011-10-14 ENCOUNTER — Encounter (HOSPITAL_COMMUNITY): Payer: Self-pay

## 2011-10-14 NOTE — Pre-Procedure Instructions (Signed)
Patient no-showed for PAT appt 10/14/11 at 0830.

## 2011-10-17 ENCOUNTER — Encounter (HOSPITAL_COMMUNITY)
Admission: RE | Admit: 2011-10-17 | Discharge: 2011-10-17 | Disposition: A | Discharge status: Home / Self Care | Payer: BC Managed Care – PPO | Source: Ambulatory Visit | Attending: Obstetrics and Gynecology | Admitting: Obstetrics and Gynecology

## 2011-10-17 ENCOUNTER — Encounter (HOSPITAL_COMMUNITY): Payer: Self-pay

## 2011-10-17 DIAGNOSIS — Z01812 Encounter for preprocedural laboratory examination: Secondary | ICD-10-CM | POA: Insufficient documentation

## 2011-10-17 DIAGNOSIS — Z01818 Encounter for other preprocedural examination: Secondary | ICD-10-CM | POA: Insufficient documentation

## 2011-10-17 LAB — CBC
HCT: 40.1 % (ref 36.0–46.0)
Hemoglobin: 13.5 g/dL (ref 12.0–15.0)
MCH: 30.5 pg (ref 26.0–34.0)
MCHC: 33.7 g/dL (ref 30.0–36.0)
MCV: 90.7 fL (ref 78.0–100.0)
Platelets: 241 10*3/uL (ref 150–400)
RBC: 4.42 MIL/uL (ref 3.87–5.11)
RDW: 13.6 % (ref 11.5–15.5)
WBC: 7.3 10*3/uL (ref 4.0–10.5)

## 2011-10-17 LAB — BASIC METABOLIC PANEL
BUN: 6 mg/dL (ref 6–23)
CO2: 27 mEq/L (ref 19–32)
Calcium: 9.3 mg/dL (ref 8.4–10.5)
Chloride: 99 mEq/L (ref 96–112)
Creatinine, Ser: 0.58 mg/dL (ref 0.50–1.10)
GFR calc Af Amer: >90 mL/min (ref >90)
GFR calc non Af Amer: >90 mL/min (ref >90)
Glucose, Bld: 94 mg/dL (ref 70–99)
Potassium: 4 mEq/L (ref 3.5–5.1)
Sodium: 135 mEq/L (ref 135–145)

## 2011-10-17 LAB — DIFFERENTIAL
Basophils Absolute: 0 10*3/uL (ref 0.0–0.1)
Basophils Relative: 0 % (ref 0–1)
Eosinophils Absolute: 0 10*3/uL (ref 0.0–0.7)
Eosinophils Relative: 1 % (ref 0–5)
Lymphocytes Relative: 19 % (ref 12–46)
Lymphs Abs: 1.4 10*3/uL (ref 0.7–4.0)
Monocytes Absolute: 0.9 10*3/uL (ref 0.1–1.0)
Monocytes Relative: 13 % — ABNORMAL HIGH (ref 3–12)
Neutro Abs: 4.9 10*3/uL (ref 1.7–7.7)
Neutrophils Relative %: 67 % (ref 43–77)

## 2011-10-17 LAB — SURGICAL PCR SCREEN
MRSA, PCR: POSITIVE — AB
Staphylococcus aureus: POSITIVE — AB

## 2011-10-17 NOTE — Patient Instructions (Addendum)
YOUR PROCEDURE IS SCHEDULED ON:10/20/11  ENTER THROUGH [facility] AT:1230 pm  USE DESK PHONE AND DIAL [phone] TO INFORM us OF YOUR ARRIVAL  CALL [phone] IF YOU HAVE ANY QUESTIONS OR PROBLEMS PRIOR TO YOUR ARRIVAL.  REMEMBER: DO NOT EAT  AFTER MIDNIGHT : Wed.   SPECIAL INSTRUCTIONS:clear liquids ok until 10am   YOU MAY BRUSH YOUR TEETH THE MORNING OF SURGERY   TAKE THESE MEDICINES THE DAY OF SURGERY WITH SIP OF WATER:all am meds   DO NOT WEAR JEWELRY, EYE MAKEUP, LIPSTICK OR DARK FINGERNAIL POLISH DO NOT WEAR LOTIONS  DO NOT SHAVE FOR 48 HOURS PRIOR TO SURGERY  YOU WILL NOT BE ALLOWED TO DRIVE YOURSELF HOME.

## 2011-10-18 NOTE — Pre-Procedure Instructions (Addendum)
Abnormal EKG, along with 2010 EKG ( for comparison) shown to Dr. Sherron Ales who wants pt to have cardiologist appt prior to surgery. Dr. Dion Body notified by Dr. Jean Rosenthal. Dr. Ginnie Smart nurse made appt for pt to be seen by Dr. Eldridge Dace on 10/18/11. I faxed the EKG to Dr. Ginnie Smart nurse who will send it to cardiologist. Office notified (x3 ph calls) about pt stating consent form is wrong..the patient states she was told by Dr. Dion Body that she is having abdominal, not vaginal, hysterectomy.  Today, Myrene, office scheduler, will leave note for Dr. Dion Body, requesting consent form update.

## 2011-10-19 NOTE — Pre-Procedure Instructions (Signed)
Patient's surgery has been cancelled.  Patient was unable to be cleared for cardiac clearance by cardio;ogist.  Further testing by cardiologist  - ECHO, Stress test per patient.

## 2011-10-20 ENCOUNTER — Ambulatory Visit (HOSPITAL_COMMUNITY)
Admission: RE | Admit: 2011-10-20 | Payer: BC Managed Care – PPO | Source: Ambulatory Visit | Admitting: Obstetrics and Gynecology

## 2011-10-20 ENCOUNTER — Encounter (HOSPITAL_COMMUNITY): Admission: RE | Payer: Self-pay | Source: Ambulatory Visit

## 2011-10-20 SURGERY — HYSTERECTOMY, VAGINAL
Anesthesia: Choice | Laterality: Left

## 2011-11-17 ENCOUNTER — Other Ambulatory Visit: Payer: Self-pay | Admitting: Obstetrics and Gynecology

## 2011-11-25 ENCOUNTER — Encounter (HOSPITAL_COMMUNITY): Payer: Self-pay

## 2011-12-06 MED ORDER — DEXTROSE 5 % IV SOLN
2.0000 g | INTRAVENOUS | Status: AC
  Administered 2011-12-07 (×2): 2 g via INTRAVENOUS
  Filled 2011-12-06: qty 2

## 2011-12-07 ENCOUNTER — Ambulatory Visit (HOSPITAL_COMMUNITY): Payer: BC Managed Care – PPO | Admitting: Anesthesiology

## 2011-12-07 ENCOUNTER — Encounter (HOSPITAL_COMMUNITY): Payer: Self-pay | Admitting: Anesthesiology

## 2011-12-07 ENCOUNTER — Encounter (HOSPITAL_COMMUNITY)
Admission: RE | Disposition: A | Discharge status: Home / Self Care | Payer: Self-pay | Source: Ambulatory Visit | Attending: Obstetrics and Gynecology

## 2011-12-07 ENCOUNTER — Encounter (HOSPITAL_COMMUNITY): Payer: Self-pay

## 2011-12-07 ENCOUNTER — Encounter (HOSPITAL_COMMUNITY): Payer: Self-pay | Admitting: *Deleted

## 2011-12-07 ENCOUNTER — Ambulatory Visit (HOSPITAL_COMMUNITY)
Admission: RE | Admit: 2011-12-07 | Discharge: 2011-12-08 | Disposition: A | Discharge status: Home / Self Care | Payer: BC Managed Care – PPO | Source: Ambulatory Visit | Attending: Obstetrics and Gynecology | Admitting: Obstetrics and Gynecology

## 2011-12-07 DIAGNOSIS — Z90710 Acquired absence of both cervix and uterus: Secondary | ICD-10-CM

## 2011-12-07 DIAGNOSIS — N8111 Cystocele, midline: Secondary | ICD-10-CM | POA: Insufficient documentation

## 2011-12-07 DIAGNOSIS — N871 Moderate cervical dysplasia: Secondary | ICD-10-CM | POA: Insufficient documentation

## 2011-12-07 DIAGNOSIS — N816 Rectocele: Secondary | ICD-10-CM | POA: Insufficient documentation

## 2011-12-07 HISTORY — PX: LAPAROSCOPIC ASSISTED VAGINAL HYSTERECTOMY: SHX5398

## 2011-12-07 LAB — CBC
HCT: 42.7 % (ref 36.0–46.0)
Hemoglobin: 13.9 g/dL (ref 12.0–15.0)
MCH: 30.3 pg (ref 26.0–34.0)
MCHC: 32.6 g/dL (ref 30.0–36.0)
MCV: 93 fL (ref 78.0–100.0)
Platelets: 313 10*3/uL (ref 150–400)
RBC: 4.59 MIL/uL (ref 3.87–5.11)
RDW: 14.6 % (ref 11.5–15.5)
WBC: 6.6 10*3/uL (ref 4.0–10.5)

## 2011-12-07 LAB — BASIC METABOLIC PANEL
BUN: 7 mg/dL (ref 6–23)
CO2: 24 mEq/L (ref 19–32)
Calcium: 9.6 mg/dL (ref 8.4–10.5)
Chloride: 106 mEq/L (ref 96–112)
Creatinine, Ser: 0.49 mg/dL — ABNORMAL LOW (ref 0.50–1.10)
GFR calc Af Amer: >90 mL/min (ref >90)
GFR calc non Af Amer: >90 mL/min (ref >90)
Glucose, Bld: 88 mg/dL (ref 70–99)
Potassium: 3.8 mEq/L (ref 3.5–5.1)
Sodium: 141 mEq/L (ref 135–145)

## 2011-12-07 LAB — SURGICAL PCR SCREEN
MRSA, PCR: NEGATIVE
Staphylococcus aureus: NEGATIVE

## 2011-12-07 LAB — PREPARE RBC (CROSSMATCH)

## 2011-12-07 LAB — ABO/RH: ABO/RH(D): AB POS

## 2011-12-07 SURGERY — HYSTERECTOMY, VAGINAL, LAPAROSCOPY-ASSISTED
Anesthesia: General | Site: Abdomen | Wound class: Clean Contaminated

## 2011-12-07 MED ORDER — ONDANSETRON HCL 4 MG/2ML IJ SOLN: 4.0000 mg | Freq: Four times a day (QID) | INTRAMUSCULAR | Status: DC | PRN

## 2011-12-07 MED ORDER — FENTANYL 10 MCG/ML IV SOLN
INTRAVENOUS | Status: DC
  Administered 2011-12-07: 20 ug via INTRAVENOUS
  Administered 2011-12-07: 30 ug via INTRAVENOUS
  Administered 2011-12-07: 18:00:00 via INTRAVENOUS
  Filled 2011-12-07: qty 50

## 2011-12-07 MED ORDER — NEOSTIGMINE METHYLSULFATE 1 MG/ML IJ SOLN
INTRAMUSCULAR | Status: AC
Start: 2011-12-07 — End: 2011-12-07
  Filled 2011-12-07: qty 10

## 2011-12-07 MED ORDER — DOCUSATE SODIUM 100 MG PO CAPS
100.0000 mg | ORAL_CAPSULE | Freq: Two times a day (BID) | ORAL | Status: DC
  Administered 2011-12-07 – 2011-12-08 (×2): 100 mg via ORAL
  Filled 2011-12-07 (×2): qty 1

## 2011-12-07 MED ORDER — MIDAZOLAM HCL 2 MG/2ML IJ SOLN: 0.5000 mg | Freq: Once | INTRAMUSCULAR | Status: DC | PRN

## 2011-12-07 MED ORDER — MIDAZOLAM HCL 2 MG/2ML IJ SOLN
INTRAMUSCULAR | Status: AC
  Filled 2011-12-07: qty 2

## 2011-12-07 MED ORDER — NALOXONE HCL 0.4 MG/ML IJ SOLN: 0.4000 mg | INTRAMUSCULAR | Status: DC | PRN

## 2011-12-07 MED ORDER — PROMETHAZINE HCL 25 MG/ML IJ SOLN
INTRAMUSCULAR | Status: AC
  Filled 2011-12-07: qty 1

## 2011-12-07 MED ORDER — PROPOFOL 10 MG/ML IV BOLUS: INTRAVENOUS | Status: DC | PRN

## 2011-12-07 MED ORDER — HYDROMORPHONE HCL PF 1 MG/ML IJ SOLN
INTRAMUSCULAR | Status: DC | PRN
  Administered 2011-12-07: 1 mg via INTRAVENOUS

## 2011-12-07 MED ORDER — EPHEDRINE SULFATE 50 MG/ML IJ SOLN
INTRAMUSCULAR | Status: DC | PRN
  Administered 2011-12-07 (×2): 10 mg via INTRAVENOUS

## 2011-12-07 MED ORDER — ATROPINE SULFATE 0.4 MG/ML IJ SOLN
INTRAMUSCULAR | Status: DC | PRN
  Administered 2011-12-07: 0.2 mg via INTRAVENOUS

## 2011-12-07 MED ORDER — GLYCOPYRROLATE 0.2 MG/ML IJ SOLN
INTRAMUSCULAR | Status: AC
Start: 2011-12-07 — End: 2011-12-07
  Filled 2011-12-07: qty 2

## 2011-12-07 MED ORDER — GLYCOPYRROLATE 0.2 MG/ML IJ SOLN
INTRAMUSCULAR | Status: DC | PRN
  Administered 2011-12-07: 0.2 mg via INTRAVENOUS
  Administered 2011-12-07: 0.1 mg via INTRAVENOUS

## 2011-12-07 MED ORDER — DEXAMETHASONE SODIUM PHOSPHATE 10 MG/ML IJ SOLN
INTRAMUSCULAR | Status: DC | PRN
  Administered 2011-12-07: 10 mg via INTRAVENOUS

## 2011-12-07 MED ORDER — BUPIVACAINE HCL (PF) 0.25 % IJ SOLN
INTRAMUSCULAR | Status: DC | PRN
  Administered 2011-12-07: 7 mL

## 2011-12-07 MED ORDER — ROCURONIUM BROMIDE 50 MG/5ML IV SOLN
INTRAVENOUS | Status: AC
  Filled 2011-12-07: qty 1

## 2011-12-07 MED ORDER — TRAMADOL HCL 50 MG PO TABS
50.0000 mg | ORAL_TABLET | Freq: Four times a day (QID) | ORAL | Status: DC | PRN
  Administered 2011-12-08 (×3): 50 mg via ORAL
  Filled 2011-12-07 (×3): qty 1

## 2011-12-07 MED ORDER — ATROPINE SULFATE 0.4 MG/ML IJ SOLN
INTRAMUSCULAR | Status: AC
Start: 2011-12-07 — End: 2011-12-07
  Filled 2011-12-07: qty 1

## 2011-12-07 MED ORDER — ONDANSETRON HCL 4 MG PO TABS: 4.0000 mg | ORAL_TABLET | Freq: Four times a day (QID) | ORAL | Status: DC | PRN

## 2011-12-07 MED ORDER — IODINE STRONG (LUGOLS) 5 % PO SOLN
ORAL | Status: DC | PRN
  Administered 2011-12-07: 0.1 mL

## 2011-12-07 MED ORDER — LACTATED RINGERS IV SOLN: INTRAVENOUS | Status: DC

## 2011-12-07 MED ORDER — LIDOCAINE HCL (CARDIAC) 20 MG/ML IV SOLN
INTRAVENOUS | Status: DC | PRN
  Administered 2011-12-07: 40 mg via INTRAVENOUS
  Administered 2011-12-07: 60 mg via INTRAVENOUS

## 2011-12-07 MED ORDER — LEVOFLOXACIN 500 MG PO TABS
500.0000 mg | ORAL_TABLET | Freq: Every day | ORAL | Status: DC
  Administered 2011-12-08: 500 mg via ORAL
  Filled 2011-12-07 (×2): qty 1

## 2011-12-07 MED ORDER — DIPHENHYDRAMINE HCL 50 MG/ML IJ SOLN: 12.5000 mg | Freq: Four times a day (QID) | INTRAMUSCULAR | Status: DC | PRN

## 2011-12-07 MED ORDER — HYDROMORPHONE HCL PF 1 MG/ML IJ SOLN
INTRAMUSCULAR | Status: AC
  Filled 2011-12-07: qty 1

## 2011-12-07 MED ORDER — VASOPRESSIN 20 UNIT/ML IJ SOLN
INTRAVENOUS | Status: DC | PRN
  Administered 2011-12-07: 15:00:00 via INTRAMUSCULAR

## 2011-12-07 MED ORDER — LACTATED RINGERS IV SOLN
INTRAVENOUS | Status: DC
  Administered 2011-12-07 (×3): via INTRAVENOUS

## 2011-12-07 MED ORDER — ADULT MULTIVITAMIN W/MINERALS CH
1.0000 | ORAL_TABLET | Freq: Every day | ORAL | Status: DC
  Administered 2011-12-08: 1 via ORAL
  Filled 2011-12-07 (×2): qty 1

## 2011-12-07 MED ORDER — ONDANSETRON HCL 4 MG/2ML IJ SOLN
INTRAMUSCULAR | Status: DC | PRN
  Administered 2011-12-07: 4 mg via INTRAVENOUS

## 2011-12-07 MED ORDER — DEXAMETHASONE SODIUM PHOSPHATE 10 MG/ML IJ SOLN
INTRAMUSCULAR | Status: AC
  Filled 2011-12-07: qty 1

## 2011-12-07 MED ORDER — SODIUM CHLORIDE 0.9 % IJ SOLN: 9.0000 mL | INTRAMUSCULAR | Status: DC | PRN

## 2011-12-07 MED ORDER — MIDAZOLAM HCL 5 MG/5ML IJ SOLN
INTRAMUSCULAR | Status: DC | PRN
  Administered 2011-12-07: 2 mg via INTRAVENOUS

## 2011-12-07 MED ORDER — MENTHOL 3 MG MT LOZG: 1.0000 | LOZENGE | OROMUCOSAL | Status: DC | PRN

## 2011-12-07 MED ORDER — ONDANSETRON HCL 4 MG/2ML IJ SOLN
INTRAMUSCULAR | Status: AC
  Filled 2011-12-07: qty 2

## 2011-12-07 MED ORDER — 0.9 % SODIUM CHLORIDE (POUR BTL) OPTIME
TOPICAL | Status: DC | PRN
  Administered 2011-12-07 (×2): 1000 mL

## 2011-12-07 MED ORDER — NEBIVOLOL HCL 10 MG PO TABS
10.0000 mg | ORAL_TABLET | Freq: Every day | ORAL | Status: DC
  Filled 2011-12-07 (×2): qty 1

## 2011-12-07 MED ORDER — BUPIVACAINE HCL (PF) 0.25 % IJ SOLN
INTRAMUSCULAR | Status: AC
Start: 2011-12-07 — End: 2011-12-07
  Filled 2011-12-07: qty 30

## 2011-12-07 MED ORDER — KETOROLAC TROMETHAMINE 30 MG/ML IJ SOLN: 15.0000 mg | Freq: Once | INTRAMUSCULAR | Status: DC | PRN

## 2011-12-07 MED ORDER — FENTANYL CITRATE 0.05 MG/ML IJ SOLN: 25.0000 ug | INTRAMUSCULAR | Status: DC | PRN

## 2011-12-07 MED ORDER — IBUPROFEN 600 MG PO TABS
600.0000 mg | ORAL_TABLET | Freq: Four times a day (QID) | ORAL | Status: DC | PRN
  Filled 2011-12-07: qty 1

## 2011-12-07 MED ORDER — DIPHENHYDRAMINE HCL 12.5 MG/5ML PO ELIX: 12.5000 mg | ORAL_SOLUTION | Freq: Four times a day (QID) | ORAL | Status: DC | PRN

## 2011-12-07 MED ORDER — DEXTROSE 5 % IV SOLN
2.0000 g | INTRAVENOUS | Status: DC | PRN
  Filled 2011-12-07: qty 2

## 2011-12-07 MED ORDER — MUPIROCIN 2 % EX OINT
TOPICAL_OINTMENT | CUTANEOUS | Status: AC
  Filled 2011-12-07: qty 22

## 2011-12-07 MED ORDER — FENTANYL CITRATE 0.05 MG/ML IJ SOLN
INTRAMUSCULAR | Status: AC
  Filled 2011-12-07: qty 5

## 2011-12-07 MED ORDER — SIMETHICONE 80 MG PO CHEW: 80.0000 mg | CHEWABLE_TABLET | Freq: Four times a day (QID) | ORAL | Status: DC | PRN

## 2011-12-07 MED ORDER — ROCURONIUM BROMIDE 100 MG/10ML IV SOLN
INTRAVENOUS | Status: DC | PRN
  Administered 2011-12-07: 40 mg via INTRAVENOUS
  Administered 2011-12-07: 10 mg via INTRAVENOUS

## 2011-12-07 MED ORDER — VASOPRESSIN 20 UNIT/ML IJ SOLN
INTRAMUSCULAR | Status: AC
  Filled 2011-12-07: qty 1

## 2011-12-07 MED ORDER — PROPOFOL 10 MG/ML IV BOLUS
INTRAVENOUS | Status: DC | PRN
  Administered 2011-12-07: 150 mg via INTRAVENOUS

## 2011-12-07 MED ORDER — FLUOXETINE HCL 20 MG PO TABS
20.0000 mg | ORAL_TABLET | Freq: Every day | ORAL | Status: DC
  Administered 2011-12-08: 20 mg via ORAL
  Filled 2011-12-07 (×2): qty 1

## 2011-12-07 MED ORDER — MEPERIDINE HCL 25 MG/ML IJ SOLN: 6.2500 mg | INTRAMUSCULAR | Status: DC | PRN

## 2011-12-07 MED ORDER — FENTANYL CITRATE 0.05 MG/ML IJ SOLN
INTRAMUSCULAR | Status: DC | PRN
  Administered 2011-12-07: 100 ug via INTRAVENOUS
  Administered 2011-12-07: 50 ug via INTRAVENOUS
  Administered 2011-12-07: 100 ug via INTRAVENOUS

## 2011-12-07 MED ORDER — LIDOCAINE HCL (CARDIAC) 20 MG/ML IV SOLN
INTRAVENOUS | Status: AC
  Filled 2011-12-07: qty 5

## 2011-12-07 MED ORDER — PROPOFOL 10 MG/ML IV EMUL
INTRAVENOUS | Status: AC
  Filled 2011-12-07: qty 20

## 2011-12-07 MED ORDER — PROMETHAZINE HCL 25 MG/ML IJ SOLN: 6.2500 mg | INTRAMUSCULAR | Status: DC | PRN

## 2011-12-07 SURGICAL SUPPLY — 49 items
ADH SKN CLS APL DERMABOND .7 (GAUZE/BANDAGES/DRESSINGS) ×2
APL SKNCLS STERI-STRIP NONHPOA (GAUZE/BANDAGES/DRESSINGS)
APPLICATOR COTTON TIP 6IN STRL (MISCELLANEOUS) ×3 IMPLANT
BARRIER ADHS 3X4 INTERCEED (GAUZE/BANDAGES/DRESSINGS) ×1 IMPLANT
BENZOIN TINCTURE PRP APPL 2/3 (GAUZE/BANDAGES/DRESSINGS) ×2 IMPLANT
BRR ADH 4X3 ABS CNTRL BYND (GAUZE/BANDAGES/DRESSINGS) ×2
CHLORAPREP W/TINT 26ML (MISCELLANEOUS) ×3 IMPLANT
CLOTH BEACON ORANGE TIMEOUT ST (SAFETY) ×3 IMPLANT
CONT PATH 16OZ SNAP LID 3702 (MISCELLANEOUS) ×3 IMPLANT
COVER TABLE BACK 60X90 (DRAPES) ×3 IMPLANT
DECANTER SPIKE VIAL GLASS SM (MISCELLANEOUS) ×3 IMPLANT
DERMABOND ADVANCED (GAUZE/BANDAGES/DRESSINGS) ×1
DERMABOND ADVANCED .7 DNX12 (GAUZE/BANDAGES/DRESSINGS) ×2 IMPLANT
ELECT LIGASURE LONG (ELECTRODE) IMPLANT
ELECT LIGASURE SHORT 9 REUSE (ELECTRODE) ×1 IMPLANT
ELECT REM PT RETURN 9FT ADLT (ELECTROSURGICAL) ×3
ELECTRODE REM PT RTRN 9FT ADLT (ELECTROSURGICAL) IMPLANT
FILTER SMOKE EVAC LAPAROSHD (FILTER) ×1 IMPLANT
FORCEPS CUTTING 33CM 5MM (CUTTING FORCEPS) ×1 IMPLANT
GLOVE BIO SURGEON STRL SZ7 (GLOVE) ×12 IMPLANT
GLOVE BIOGEL PI IND STRL 7.0 (GLOVE) ×6 IMPLANT
GLOVE BIOGEL PI INDICATOR 7.0 (GLOVE) ×3
GLOVE ECLIPSE 6.0 STRL STRAW (GLOVE) ×3 IMPLANT
GOWN PREVENTION PLUS LG XLONG (DISPOSABLE) ×9 IMPLANT
GOWN PREVENTION PLUS XLARGE (GOWN DISPOSABLE) ×4 IMPLANT
MANIPULATOR UTERINE 4.5 ZUMI (MISCELLANEOUS) IMPLANT
NS IRRIG 1000ML POUR BTL (IV SOLUTION) ×3 IMPLANT
PACK LAVH (CUSTOM PROCEDURE TRAY) ×3 IMPLANT
PROTECTOR NERVE ULNAR (MISCELLANEOUS) ×4 IMPLANT
SET IRRIG TUBING LAPAROSCOPIC (IRRIGATION / IRRIGATOR) IMPLANT
SLEEVE Z-THREAD 5X100MM (TROCAR) ×6 IMPLANT
SOLUTION ELECTROLUBE (MISCELLANEOUS) ×1 IMPLANT
STRIP CLOSURE SKIN 1/4X4 (GAUZE/BANDAGES/DRESSINGS) ×2 IMPLANT
SUT CHROMIC 2 0 SH (SUTURE) IMPLANT
SUT CHROMIC 2 0 UR 5 27 (SUTURE) IMPLANT
SUT MNCRL AB 4-0 PS2 18 (SUTURE) ×1 IMPLANT
SUT VIC AB 0 CT1 18XCR BRD8 (SUTURE) ×6 IMPLANT
SUT VIC AB 0 CT1 36 (SUTURE) ×5 IMPLANT
SUT VIC AB 0 CT1 8-18 (SUTURE) ×6
SUT VIC AB 2-0 CT1 (SUTURE) ×6 IMPLANT
SUT VICRYL 0 UR6 27IN ABS (SUTURE) ×3 IMPLANT
SUT VICRYL 1 TIES 12X18 (SUTURE) ×3 IMPLANT
SUT VICRYL 4-0 PS2 18IN ABS (SUTURE) ×3 IMPLANT
TOWEL OR 17X24 6PK STRL BLUE (TOWEL DISPOSABLE) ×6 IMPLANT
TRAY FOLEY CATH 14FR (SET/KITS/TRAYS/PACK) ×3 IMPLANT
TROCAR Z-THREAD FIOS 11X100 BL (TROCAR) IMPLANT
TROCAR Z-THREAD FIOS 5X100MM (TROCAR) ×3 IMPLANT
WARMER LAPAROSCOPE (MISCELLANEOUS) ×1 IMPLANT
WATER STERILE IRR 1000ML POUR (IV SOLUTION) ×2 IMPLANT

## 2011-12-07 NOTE — Brief Op Note (Signed)
12/07/2011  5:48 PM  PATIENT:  Amanda [name]  61 y.o. female  PRE-OPERATIVE DIAGNOSIS:  Cervical Dysplasia (CIN II, recurrent)  POST-OPERATIVE DIAGNOSIS:  Cervical Dysplasia  PROCEDURE:  Procedure(s) (LRB) with comments: LAPAROSCOPIC ASSISTED VAGINAL HYSTERECTOMY (N/A) LAPAROSCOPIC BILATERAL SALPINGO OOPHERECTOMY (Bilateral) - left fallopian tube only. Bilateral ovaries.  SURGEON:  Surgeon(s) and Role:    * Geryl Rankins, MD - Primary    * Dorien Chihuahua. Richardson Dopp, MD - Assisting  PHYSICIAN ASSISTANT: Dr. Richardson Dopp  ASSISTANTS: Technician   ANESTHESIA:   local and general  EBL:  Total I/O In: 3200 [I.V.:3200] Out: 750 [Urine:650; Blood:100]  BLOOD ADMINISTERED:none  DRAINS: Urinary Catheter (Foley)   LOCAL MEDICATIONS USED:  MARCAINE    and OTHER Vasopressin  SPECIMEN:  Source of Specimen:  Uterus, Left tube and ovary, right ovary  DISPOSITION OF SPECIMEN:  PATHOLOGY  COUNTS:  YES  TOURNIQUET:  * No tourniquets in log *  DICTATION: .Other Dictation: Dictation Number [id number]  PLAN OF CARE: Admit for overnight observation  PATIENT DISPOSITION:  PACU - hemodynamically stable.   Delay start of Pharmacological VTE agent (>24hrs) due to surgical blood loss or risk of bleeding: not applicable

## 2011-12-07 NOTE — Transfer of Care (Signed)
Immediate Anesthesia Transfer of Care Note  Patient: Amanda [name]  Procedure(s) Performed: Procedure(s) (LRB) with comments: LAPAROSCOPIC ASSISTED VAGINAL HYSTERECTOMY (N/A) LAPAROSCOPIC BILATERAL SALPINGO OOPHERECTOMY (Bilateral) - left fallopian tube only. Bilateral ovaries.  Patient Location: PACU  Anesthesia Type: General  Level of Consciousness: awake, alert  and oriented  Airway & Oxygen Therapy: Patient Spontanous Breathing and Patient connected to nasal cannula oxygen  Post-op Assessment: Report given to PACU RN and Post -op Vital signs reviewed and stable  Post vital signs: Reviewed and stable  Complications: No apparent anesthesia complications

## 2011-12-07 NOTE — Anesthesia Procedure Notes (Addendum)
Procedure Name: Intubation Date/Time: 12/07/2011 1:28 PM Performed by: Graciela Husbands Pre-anesthesia Checklist: Suction available, Emergency Drugs available, Timeout performed, Patient identified and Patient being monitored Patient Re-evaluated:Patient Re-evaluated prior to inductionOxygen Delivery Method: Circle system utilized Preoxygenation: Pre-oxygenation with 100% oxygen Intubation Type: IV induction Ventilation: Mask ventilation without difficulty and Oral airway inserted - appropriate to patient size Laryngoscope Size: Mac and 3 Grade View: Grade I Tube size: 7.0 mm Number of attempts: 1 Airway Equipment and Method: Stylet Placement Confirmation: ETT inserted through vocal cords under direct vision,  breath sounds checked- equal and bilateral and positive ETCO2 Secured at: 21 cm Tube secured with: Tape   Performed by: Graciela Husbands
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
## 2011-12-07 NOTE — Anesthesia Preprocedure Evaluation (Addendum)
Anesthesia Evaluation  Patient identified by MRN, date of birth, ID band Patient awake    Reviewed: Allergy & Precautions, H&P , Patient's Chart, lab work & pertinent test results, reviewed documented beta blocker date and time   History of Anesthesia Complications Negative for: history of anesthetic complications  Airway Mallampati: II TM Distance: >3 FB Neck ROM: full    Dental No notable dental hx.    Pulmonary neg pulmonary ROS,  breath sounds clear to auscultation  Pulmonary exam normal       Cardiovascular Exercise Tolerance: Good hypertension, negative cardio ROS  Rhythm:regular Rate:Normal     Neuro/Psych negative neurological ROS  negative psych ROS   GI/Hepatic negative GI ROS, Neg liver ROS,   Endo/Other  negative endocrine ROS  Renal/GU negative Renal ROS     Musculoskeletal   Abdominal   Peds  Hematology negative hematology ROS (+)   Anesthesia Other Findings   Reproductive/Obstetrics negative OB ROS                           Anesthesia Physical Anesthesia Plan  ASA: II  Anesthesia Plan: General ETT   Post-op Pain Management:    Induction:   Airway Management Planned:   Additional Equipment:   Intra-op Plan:   Post-operative Plan:   Informed Consent: I have reviewed the patients History and Physical, chart, labs and discussed the procedure including the risks, benefits and alternatives for the proposed anesthesia with the patient or authorized representative who has indicated his/her understanding and acceptance.   Dental Advisory Given  Plan Discussed with: CRNA and Surgeon  Anesthesia Plan Comments:        cut on patient's lip with mild superficial infection.  Discussed risk and benefits (with the patient, her husband, and the surgeon) of waiting for this to heal prior to surgery versus proceeding today. Patient would like to proceed with understanding  that her lip wound could end up worsening over the course of general anesthesia.  She has had no constitutional symptoms from her lip wound.  Her questions were answered. Anesthesia Quick Evaluation
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
## 2011-12-08 ENCOUNTER — Encounter (HOSPITAL_COMMUNITY): Payer: Self-pay | Admitting: *Deleted

## 2011-12-08 LAB — TYPE AND SCREEN
ABO/RH(D): AB POS
Antibody Screen: NEGATIVE
Unit division: 0
Unit division: 0

## 2011-12-08 LAB — CBC
HCT: 36.6 % (ref 36.0–46.0)
Hemoglobin: 12 g/dL (ref 12.0–15.0)
MCH: 30.5 pg (ref 26.0–34.0)
MCHC: 32.8 g/dL (ref 30.0–36.0)
MCV: 92.9 fL (ref 78.0–100.0)
Platelets: 271 10*3/uL (ref 150–400)
RBC: 3.94 MIL/uL (ref 3.87–5.11)
RDW: 14.6 % (ref 11.5–15.5)
WBC: 12.2 10*3/uL — ABNORMAL HIGH (ref 4.0–10.5)

## 2011-12-08 MED ORDER — LEVOFLOXACIN 500 MG PO TABS: 500.0000 mg | ORAL_TABLET | Freq: Every day | ORAL | Status: AC

## 2011-12-08 MED ORDER — TRAMADOL HCL 50 MG PO TABS: 50.0000 mg | ORAL_TABLET | Freq: Four times a day (QID) | ORAL | Status: AC | PRN

## 2011-12-08 MED ORDER — IBUPROFEN 600 MG PO TABS: 600.0000 mg | ORAL_TABLET | Freq: Four times a day (QID) | ORAL | Status: AC | PRN

## 2011-12-08 NOTE — Anesthesia Postprocedure Evaluation (Signed)
  Anesthesia Post-op Note  Patient: Amanda [name]  Procedure(s) Performed: Procedure(s) (LRB) with comments: LAPAROSCOPIC ASSISTED VAGINAL HYSTERECTOMY (N/A) LAPAROSCOPIC BILATERAL SALPINGO OOPHERECTOMY (Bilateral) - left fallopian tube only. Bilateral ovaries.  Patient Location: Mother/Baby  Anesthesia Type: General  Level of Consciousness: awake  Airway and Oxygen Therapy: Patient Spontanous Breathing  Post-op Pain: mild  Post-op Assessment: Post-op Vital signs reviewed  Post-op Vital Signs: Reviewed and stable  Complications: No apparent anesthesia complications

## 2011-12-08 NOTE — Progress Notes (Signed)
1 Day Post-Op Procedure(s) (LRB): LAPAROSCOPIC ASSISTED VAGINAL HYSTERECTOMY (N/A) LAPAROSCOPIC BILATERAL SALPINGO OOPHERECTOMY (Bilateral)  Subjective: Patient without complaints.  Pain well controlled.  Tolerating a regular diet. Ambulating and voiding well.   Objective: I have reviewed patient's vital signs, intake and output and labs. UOP excellent  General: alert, cooperative and no distress GI: normal findings: no masses palpable and Normal bowel sounds, incisions intact.  Appropriately tender. Extremities: extremities normal, atraumatic, no cyanosis or edema  Assessment: s/p Procedure(s) (LRB) with comments: LAPAROSCOPIC ASSISTED VAGINAL HYSTERECTOMY (N/A) LAPAROSCOPIC BILATERAL SALPINGO OOPHERECTOMY (Bilateral) - left fallopian tube only. Bilateral ovaries.: stable, progressing well and tolerating diet  Plan: Discharge home Ultram and Ibuprofen for  Pain. Pt with h/o alcoholism. Dependency issues d/w husband at length. F/u in 2 weeks for post op. Bradycardia and hypotension is improving.  Pt to resume beta blocker tomorrow morning.  LOS: 1 day    Amanda [name] 12/08/2011, 1:47 PM
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
## 2011-12-08 NOTE — Discharge Summary (Signed)
Physician Discharge Summary  Patient ID: Amanda [name] MRN: [id number] DOB/AGE: 1950/05/12 61 y.o.  Admit date: 12/07/2011 Discharge date: 12/08/2011  Admission Diagnoses:  Discharge Diagnoses:  Active Problems:  * No active hospital problems. *    Discharged Condition: good  Hospital Course: S/p LAVH pt with bradycardia and hypotension that improved.  Beta blocker held.  Pain controlled well with Fentanyl PCA then po Ultram and Motrin.UOP good  And hg good.  Consults: None  Significant Diagnostic Studies: labs: 13.9 to 12  Treatments: surgery: LAVH/BSO  Discharge Exam: Blood pressure 106/52, pulse 60, temperature 98.4 F (36.9 C), temperature source Oral, resp. rate 18, height 5\' 2"  (1.575 m), weight 64.411 kg (142 lb), SpO2 97.00%. See exam on progress note.  Disposition: 01-Home or Self Care  Discharge Orders    Future Orders Please Complete By Expires   Diet - low sodium heart healthy      Increase activity slowly      Discharge instructions      Comments:   See discharge instructions.   Driving Restrictions      Comments:   No driving for 1 week or while narcotics are being used.   Sexual Activity Restrictions      Comments:   Pelvic rest x 6 weeks.   Lifting restrictions      Comments:   No lifting greater than 15 pounds x 6 weeks.   Discharge wound care:      Comments:   Keep incisions clean and dry.  Nothing in the vagina.   Call MD for:  temperature >100.4      Call MD for:  severe uncontrolled pain      Call MD for:  redness, tenderness, or signs of infection (pain, swelling, redness, odor or green/yellow discharge around incision site)      Call MD for:  extreme fatigue        Medication List  As of 12/08/2011  1:47 PM   TAKE these medications         aspirin EC 81 MG tablet   Take 81 mg by mouth daily.      CALCIUM 600 + D PO   Take 2 tablets by mouth daily.      FLUoxetine 20 MG tablet   Commonly known as: PROZAC   Take 20 mg by mouth daily.        ibuprofen 600 MG tablet   Commonly known as: ADVIL,MOTRIN   Take 1 tablet (600 mg total) by mouth every 6 (six) hours as needed (mild pain).      multivitamin with minerals Tabs   Take 1 tablet by mouth daily.      nebivolol 10 MG tablet   Commonly known as: BYSTOLIC   Take 10 mg by mouth daily.      RA FISH OIL 900 MG Caps   Take 900 mg by mouth daily.      traMADol 50 MG tablet   Commonly known as: ULTRAM   Take 1 tablet (50 mg total) by mouth every 6 (six) hours as needed.           Follow-up Information    Follow up with Geryl Rankins, MD. Schedule an appointment as soon as possible for a visit in 2 weeks. (post op follow up)    Contact information:   [address] [place], [place]. [address] [place] [address] [phone]          Signed: Geryl Rankins 12/08/2011, 1:47 PM

## 2012-01-06 ENCOUNTER — Encounter (HOSPITAL_COMMUNITY): Payer: Self-pay | Admitting: *Deleted

## 2012-01-06 ENCOUNTER — Emergency Department (HOSPITAL_COMMUNITY)
Admission: EM | Admit: 2012-01-06 | Discharge: 2012-01-08 | Disposition: A | Discharge status: Other Acute Inpatient Hospital | Payer: BC Managed Care – PPO | Attending: Emergency Medicine | Admitting: Emergency Medicine

## 2012-01-06 DIAGNOSIS — I498 Other specified cardiac arrhythmias: Secondary | ICD-10-CM | POA: Insufficient documentation

## 2012-01-06 DIAGNOSIS — R5381 Other malaise: Secondary | ICD-10-CM | POA: Insufficient documentation

## 2012-01-06 DIAGNOSIS — R259 Unspecified abnormal involuntary movements: Secondary | ICD-10-CM | POA: Insufficient documentation

## 2012-01-06 DIAGNOSIS — F1010 Alcohol abuse, uncomplicated: Secondary | ICD-10-CM | POA: Insufficient documentation

## 2012-01-06 DIAGNOSIS — IMO0002 Reserved for concepts with insufficient information to code with codable children: Secondary | ICD-10-CM | POA: Insufficient documentation

## 2012-01-06 DIAGNOSIS — X58XXXA Exposure to other specified factors, initial encounter: Secondary | ICD-10-CM | POA: Insufficient documentation

## 2012-01-06 DIAGNOSIS — I10 Essential (primary) hypertension: Secondary | ICD-10-CM | POA: Insufficient documentation

## 2012-01-06 HISTORY — DX: Anxiety disorder, unspecified: F41.9

## 2012-01-06 LAB — CBC WITH DIFFERENTIAL/PLATELET
Basophils Absolute: 0 10*3/uL (ref 0.0–0.1)
Basophils Relative: 1 % (ref 0–1)
Eosinophils Absolute: 0.1 10*3/uL (ref 0.0–0.7)
Eosinophils Relative: 2 % (ref 0–5)
HCT: 41.3 % (ref 36.0–46.0)
Hemoglobin: 14.3 g/dL (ref 12.0–15.0)
Lymphocytes Relative: 31 % (ref 12–46)
Lymphs Abs: 2 10*3/uL (ref 0.7–4.0)
MCH: 31.1 pg (ref 26.0–34.0)
MCHC: 34.6 g/dL (ref 30.0–36.0)
MCV: 89.8 fL (ref 78.0–100.0)
Monocytes Absolute: 0.9 10*3/uL (ref 0.1–1.0)
Monocytes Relative: 15 % — ABNORMAL HIGH (ref 3–12)
Neutro Abs: 3.2 10*3/uL (ref 1.7–7.7)
Neutrophils Relative %: 52 % (ref 43–77)
Platelets: 295 10*3/uL (ref 150–400)
RBC: 4.6 MIL/uL (ref 3.87–5.11)
RDW: 14.5 % (ref 11.5–15.5)
WBC: 6.3 10*3/uL (ref 4.0–10.5)

## 2012-01-06 LAB — COMPREHENSIVE METABOLIC PANEL
ALT: 18 U/L (ref 0–35)
AST: 33 U/L (ref 0–37)
Albumin: 3.8 g/dL (ref 3.5–5.2)
Alkaline Phosphatase: 82 U/L (ref 39–117)
BUN: 4 mg/dL — ABNORMAL LOW (ref 6–23)
CO2: 28 mEq/L (ref 19–32)
Calcium: 9 mg/dL (ref 8.4–10.5)
Chloride: 102 mEq/L (ref 96–112)
Creatinine, Ser: 0.54 mg/dL (ref 0.50–1.10)
GFR calc Af Amer: >90 mL/min (ref >90)
GFR calc non Af Amer: >90 mL/min (ref >90)
Glucose, Bld: 87 mg/dL (ref 70–99)
Potassium: 3.6 mEq/L (ref 3.5–5.1)
Sodium: 144 mEq/L (ref 135–145)
Total Bilirubin: 0.2 mg/dL — ABNORMAL LOW (ref 0.3–1.2)
Total Protein: 7.7 g/dL (ref 6.0–8.3)

## 2012-01-06 LAB — ETHANOL: Alcohol, Ethyl (B): 357 mg/dL — ABNORMAL HIGH (ref 0–11)

## 2012-01-06 LAB — RAPID URINE DRUG SCREEN, HOSP PERFORMED
Amphetamines: NOT DETECTED
Barbiturates: NOT DETECTED
Benzodiazepines: NOT DETECTED
Cocaine: NOT DETECTED
Opiates: NOT DETECTED
Tetrahydrocannabinol: NOT DETECTED

## 2012-01-06 MED ORDER — ONDANSETRON HCL 4 MG/2ML IJ SOLN
4.0000 mg | Freq: Once | INTRAMUSCULAR | Status: AC
  Administered 2012-01-06: 4 mg via INTRAVENOUS
  Filled 2012-01-06: qty 2

## 2012-01-06 MED ORDER — SODIUM CHLORIDE 0.9 % IV BOLUS (SEPSIS)
1000.0000 mL | Freq: Once | INTRAVENOUS | Status: AC
  Administered 2012-01-06: 1000 mL via INTRAVENOUS
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
## 2012-01-06 NOTE — ED Provider Notes (Signed)
History     CSN: [id number]  Arrival date & time 01/06/12  1935   None     Chief Complaint  Patient presents with  . Psychiatric Evaluation    (Consider location/radiation/quality/duration/timing/severity/associated sxs/prior treatment) HPI Comments: Patient is visibly intoxicated, uncooperative, verbally antagonistic but has agreed to allow [place] to have blood drawn, and allow her to sober up and reevaluate in several hours.  At this time she denies suicidality, homicidality, states she does not want detox again.  "it is just a way to  take my money  The history is provided by the patient.    Past Medical History  Diagnosis Date  . Hypertension   . Depression     Past Surgical History  Procedure Date  . Colonscopy 11/2002  . Upper gastrointestinal endoscopy 11/2002  . Hemorrhoid surgery 08/2004  . Dilation and curettage of uterus 09/2007    hysteroscopy for fibroid  . Breast surgery   . Laparoscopic assisted vaginal hysterectomy 12/07/2011    Procedure: LAPAROSCOPIC ASSISTED VAGINAL HYSTERECTOMY;  Surgeon: Geryl Rankins, MD;  Location: [facility];  Service: Gynecology;  Laterality: N/A;    No family history on file.  History  Substance Use Topics  . Smoking status: Former Games developer  . Smokeless tobacco: Not on file  . Alcohol Use: Yes     pt here with strong etoh odor    OB History    Grav Para Term Preterm Abortions TAB SAB Ect Mult Living                  Review of Systems  Constitutional: Negative for fever.  Skin: Positive for wound. Negative for rash.  Neurological: Positive for weakness. Negative for dizziness and headaches.  Psychiatric/Behavioral: Positive for agitation. Negative for suicidal ideas, hallucinations and self-injury.    Allergies  Codeine and Medrol  Home Medications   Current Outpatient Rx  Name Route Sig Dispense Refill  . ASPIRIN EC 81 MG PO TBEC Oral Take 81 mg by mouth daily.    Marland Kitchen CALCIUM 600 + D PO Oral Take 2 tablets by mouth daily.     Marland Kitchen FLUOXETINE HCL 20 MG PO TABS Oral Take 20 mg by mouth daily.    . ADULT MULTIVITAMIN W/MINERALS CH Oral Take 1 tablet by mouth daily.    . NEBIVOLOL HCL 10 MG PO TABS Oral Take 10 mg by mouth daily.    Marland Kitchen RA FISH OIL 900 MG PO CAPS Oral Take 900 mg by mouth daily.      BP 152/97  Pulse 92  Temp 98.3 F (36.8 C) (Oral)  Resp 20  SpO2 93%  Physical Exam  Constitutional: She appears well-developed and well-nourished.  HENT:  Head: Normocephalic.  Eyes: Pupils are equal, round, and reactive to light.  Neck: Normal range of motion.  Cardiovascular: Normal rate.   Pulmonary/Chest: Effort normal.  Abdominal: Soft.  Musculoskeletal: Normal range of motion.       Left great toenail is black in color, without surrounding erythema.  There is also an abrasion to the heel of the right foot  Neurological: She is alert.  Skin: Skin is warm. No rash noted.  Psychiatric: Her affect is angry and inappropriate. Her speech is slurred. She is agitated and aggressive. Cognition and memory are impaired. She expresses inappropriate judgment. She expresses no suicidal ideation.    ED Course  Procedures (including critical care time)   Labs Reviewed  CBC WITH DIFFERENTIAL  COMPREHENSIVE METABOLIC PANEL  ETHANOL  URINE RAPID DRUG SCREEN (HOSP PERFORMED)   No results found.   No diagnosis found.    MDM  Patient agrees to have blood drawn with reevaluation.  When sober.  At this time.  She refuses sane exam.  Psychiatric evaluation or evaluation for detox, but is too impaired to discharge.  I did contact her husband, who refuses to pick her up. At 5:30 AM.  Patient states she is not interested in detox.  At this, time.  We'll redraw an alcohol level        Arman Filter, NP 01/19/12 [phone]
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
## 2012-01-06 NOTE — ED Notes (Signed)
Moved to [facility] POD C room 20

## 2012-01-06 NOTE — ED Notes (Signed)
Report called to [facility].

## 2012-01-06 NOTE — ED Notes (Addendum)
Incontinent Diarrhea and vomiting. Dr Manus Gunning notified.

## 2012-01-07 ENCOUNTER — Encounter (HOSPITAL_COMMUNITY): Payer: Self-pay | Admitting: *Deleted

## 2012-01-07 LAB — ETHANOL: Alcohol, Ethyl (B): 120 mg/dL — ABNORMAL HIGH (ref 0–11)

## 2012-01-07 MED ORDER — LORAZEPAM 1 MG PO TABS
1.0000 mg | ORAL_TABLET | Freq: Four times a day (QID) | ORAL | Status: DC | PRN
  Administered 2012-01-07: 1 mg via ORAL
  Filled 2012-01-07: qty 1

## 2012-01-07 MED ORDER — ADULT MULTIVITAMIN W/MINERALS CH
1.0000 | ORAL_TABLET | Freq: Every day | ORAL | Status: DC
  Administered 2012-01-07: 1 via ORAL
  Filled 2012-01-07 (×2): qty 1

## 2012-01-07 MED ORDER — VITAMIN B-1 100 MG PO TABS
100.0000 mg | ORAL_TABLET | Freq: Every day | ORAL | Status: DC
  Administered 2012-01-07: 100 mg via ORAL
  Filled 2012-01-07 (×2): qty 1

## 2012-01-07 MED ORDER — THIAMINE HCL 100 MG/ML IJ SOLN: 100.0000 mg | Freq: Every day | INTRAMUSCULAR | Status: DC

## 2012-01-07 MED ORDER — LORAZEPAM 2 MG/ML IJ SOLN: 1.0000 mg | Freq: Four times a day (QID) | INTRAMUSCULAR | Status: DC | PRN

## 2012-01-07 MED ORDER — FOLIC ACID 1 MG PO TABS
1.0000 mg | ORAL_TABLET | Freq: Every day | ORAL | Status: DC
  Administered 2012-01-07: 1 mg via ORAL
  Filled 2012-01-07 (×2): qty 1

## 2012-01-07 MED ORDER — LORAZEPAM 2 MG/ML IJ SOLN
2.0000 mg | Freq: Once | INTRAMUSCULAR | Status: AC
  Administered 2012-01-07: 2 mg via INTRAVENOUS
  Filled 2012-01-07: qty 1

## 2012-01-07 MED ORDER — LORAZEPAM 2 MG/ML IJ SOLN
1.0000 mg | Freq: Once | INTRAMUSCULAR | Status: AC
  Administered 2012-01-07: 1 mg via INTRAVENOUS
  Filled 2012-01-07: qty 1

## 2012-01-07 MED ORDER — ONDANSETRON HCL 4 MG/2ML IJ SOLN
4.0000 mg | Freq: Once | INTRAMUSCULAR | Status: AC
  Administered 2012-01-07: 4 mg via INTRAVENOUS

## 2012-01-07 MED ORDER — ONDANSETRON HCL 4 MG/2ML IJ SOLN
4.0000 mg | Freq: Once | INTRAMUSCULAR | Status: AC
  Administered 2012-01-08: 4 mg via INTRAVENOUS
  Filled 2012-01-07 (×2): qty 2

## 2012-01-07 MED ORDER — ONDANSETRON HCL 4 MG/2ML IJ SOLN
4.0000 mg | Freq: Once | INTRAMUSCULAR | Status: AC
  Administered 2012-01-07: 4 mg via INTRAVENOUS
  Filled 2012-01-07 (×2): qty 2
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
## 2012-01-07 NOTE — ED Notes (Addendum)
Patient sleeping with no vomiting and appears to have stopped shaking at this time. Continue to wait for family to call back to pick patient up from ED.

## 2012-01-07 NOTE — ED Notes (Signed)
Pt lying on bench in entryway sleeping, easily aroused. Interactive, encouraged to stay for assessment, reluctantly agreeable, ambulatory with unsteady gait to triage room.

## 2012-01-07 NOTE — ED Provider Notes (Signed)
Patient signed out to me by Dr. Ranae Palms. Basically, patient came in for alcohol intoxication. She sobered up but has no where to go and family refused to take her. She decided to go to detox. Will work on transferring to detox center. She is placed on CIWA protocol.   Richardean Canal, MD 01/07/12 [phone]
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
## 2012-01-07 NOTE — ED Notes (Signed)
Patient heart rate is 38-40. EDP advised and verbal order with readback for EKG.

## 2012-01-07 NOTE — ED Notes (Signed)
Patient actively vomiting and shaking profoundly. Patient has made calls to get a ride home and is now waiting call back.

## 2012-01-07 NOTE — ED Notes (Signed)
Paged Social worker again and left call back number on their pager.

## 2012-01-07 NOTE — ED Provider Notes (Signed)
Patient was bradycardic as per nurse, rate around 30s. Patient mentating well. EKG is below. She has no chest pain or SOB. Will place on monitor and observe.    Date: 01/07/2012  Rate: 38  Rhythm: sinus bradycardia  QRS Axis: normal  Intervals: normal  ST/T Wave abnormalities: normal  Conduction Disutrbances:none  Narrative Interpretation:   Old EKG Reviewed: changes noted    Richardean Canal, MD 01/07/12 1816

## 2012-01-07 NOTE — ED Notes (Signed)
Patient states she if feeling nauseated and nervous. EDP advised and received orders for medication for this patient.

## 2012-01-07 NOTE — ED Provider Notes (Signed)
61 year old female with a history of alcohol abuse, patient presents intoxicated with an elevated alcohol level. On my examination the patient at this time has sobered, has mild tremor but no diaphoresis or altered mental status. She has been offered detox multiple times including by the behavioral health assessment counselor Marchelle Folks, she has declined this over and over and at this time as the decision making capacity. She is not a danger to herself or others at this time and can be discharged home. On my exam she has mild tremor, soft abdomen, clear heart and lungs without tachycardia.  Medical screening examination/treatment/procedure(s) were conducted as a shared visit with non-physician practitioner(s) and myself.  I personally evaluated the patient during the encounter    Vida Roller, MD 01/07/12 [phone]

## 2012-01-07 NOTE — ED Notes (Addendum)
Patient awake and calling family and friends for a ride home. At this time no one is willing to pick patient up at ED. Social work paged for help with transportation. Christy, CN advised.

## 2012-01-07 NOTE — [facility] Counselor (Signed)
Counselor, RN and Attending consulted with pt regarding concerns about d/c.  Pt informed the attending she would agree to inpatient rehab.
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
## 2012-01-07 NOTE — BH Assessment (Signed)
[facility] Assessment Progress Note      Consulted with Mrs Amanda [name] at the request of Earley Favor, NP.  Pt does not want to pursue detox.  No further ACT involvement needed at this time.
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
## 2012-01-08 MED ORDER — NEOMYCIN-COLIST-HC-THONZONIUM 3.3-3-10-0.5 MG/ML OT SUSP
3.0000 [drp] | Freq: Four times a day (QID) | OTIC | Status: DC
  Filled 2012-01-08: qty 5

## 2012-01-08 MED ORDER — NEOMYCIN-POLYMYXIN-HC 3.5-10000-1 OT SOLN
3.0000 [drp] | Freq: Four times a day (QID) | OTIC | Status: DC
  Administered 2012-01-08: 3 [drp] via OTIC
  Filled 2012-01-08: qty 10

## 2012-01-08 NOTE — Progress Notes (Signed)
8:38 AM Pt has been accepted for transfer to [facility] by Dr. Betti Cruz.

## 2012-01-08 NOTE — Progress Notes (Signed)
9:47 AM Pt complained of pain and drainage from her right ear for the past 3 weeks.  She has been using a hydrocortisone cream in her ear, but that has not been helping.  Exam shows external otitis in right ear.  Rx with cortisporin otic drops four times per day.

## 2012-01-08 NOTE — ED Notes (Signed)
Patient heart rate continues to be in the upper 30's to low 40's. Patient on monitor and is sleeping with NAD.

## 2012-01-08 NOTE — ED Notes (Signed)
Patient has complaint of sore right ear. Patient's right ear does appear red in color and appears to have drainage and crust in the ear. Dr, Ignacia Palma advised and is to see patient before patient leaves ED to go to [facility].

## 2012-01-08 NOTE — ED Notes (Signed)
Notified Dr. Nicanor Alcon regarding pt's bradycardia.  Heart rate has decreased from 36-38's to 32 bpm.   Instructed to hold all benzodiazapine medications and continue to arouse pt.   Pt is alert and oriented - answering questions appropriately.

## 2012-01-20 NOTE — ED Provider Notes (Signed)
Medical screening examination/treatment/procedure(s) were performed by non-physician practitioner and as supervising physician I was immediately available for consultation/collaboration.   Shaunee Mulkern, MD 01/20/12 1654 

## 2012-01-29 ENCOUNTER — Emergency Department (HOSPITAL_BASED_OUTPATIENT_CLINIC_OR_DEPARTMENT_OTHER)
Admission: EM | Admit: 2012-01-29 | Discharge: 2012-01-29 | Disposition: A | Discharge status: Home / Self Care | Payer: BC Managed Care – PPO | Attending: Emergency Medicine | Admitting: Emergency Medicine

## 2012-01-29 ENCOUNTER — Encounter (HOSPITAL_BASED_OUTPATIENT_CLINIC_OR_DEPARTMENT_OTHER): Payer: Self-pay | Admitting: *Deleted

## 2012-01-29 DIAGNOSIS — L03119 Cellulitis of unspecified part of limb: Secondary | ICD-10-CM | POA: Insufficient documentation

## 2012-01-29 DIAGNOSIS — Z8669 Personal history of other diseases of the nervous system and sense organs: Secondary | ICD-10-CM | POA: Insufficient documentation

## 2012-01-29 DIAGNOSIS — Z87891 Personal history of nicotine dependence: Secondary | ICD-10-CM | POA: Insufficient documentation

## 2012-01-29 DIAGNOSIS — Z79899 Other long term (current) drug therapy: Secondary | ICD-10-CM | POA: Insufficient documentation

## 2012-01-29 DIAGNOSIS — F3289 Other specified depressive episodes: Secondary | ICD-10-CM | POA: Insufficient documentation

## 2012-01-29 DIAGNOSIS — Z7982 Long term (current) use of aspirin: Secondary | ICD-10-CM | POA: Insufficient documentation

## 2012-01-29 DIAGNOSIS — Z90710 Acquired absence of both cervix and uterus: Secondary | ICD-10-CM | POA: Insufficient documentation

## 2012-01-29 DIAGNOSIS — F329 Major depressive disorder, single episode, unspecified: Secondary | ICD-10-CM | POA: Insufficient documentation

## 2012-01-29 DIAGNOSIS — L02419 Cutaneous abscess of limb, unspecified: Secondary | ICD-10-CM | POA: Insufficient documentation

## 2012-01-29 DIAGNOSIS — F411 Generalized anxiety disorder: Secondary | ICD-10-CM | POA: Insufficient documentation

## 2012-01-29 DIAGNOSIS — Z9889 Other specified postprocedural states: Secondary | ICD-10-CM | POA: Insufficient documentation

## 2012-01-29 DIAGNOSIS — L0291 Cutaneous abscess, unspecified: Secondary | ICD-10-CM

## 2012-01-29 DIAGNOSIS — I10 Essential (primary) hypertension: Secondary | ICD-10-CM | POA: Insufficient documentation

## 2012-01-29 HISTORY — DX: Otitis media, unspecified, unspecified ear: H66.90

## 2012-01-29 MED ORDER — SULFAMETHOXAZOLE-TRIMETHOPRIM 800-160 MG PO TABS: 1.0000 | ORAL_TABLET | Freq: Two times a day (BID) | ORAL | Status: DC

## 2012-01-29 MED ORDER — CEPHALEXIN 500 MG PO CAPS: 500.0000 mg | ORAL_CAPSULE | Freq: Three times a day (TID) | ORAL | Status: DC

## 2012-01-29 NOTE — ED Notes (Addendum)
Patient states that she thinks an insect bit her on left lower leg, right thigh and under R arm. Blistered and burns . Tookdramamine this morning, experiencing nausea

## 2012-01-29 NOTE — ED Provider Notes (Signed)
History     CSN: [id number]  Arrival date & time 01/29/12  1319   First MD Initiated Contact with Patient 01/29/12 1409      Chief Complaint  Patient presents with  . Skin Ulcer    (Consider location/radiation/quality/duration/timing/severity/associated sxs/prior treatment) HPI Comments: The patient presents here with a sore, reddened area to her left lower leg.  She denies a history of bite or sting.  She felt fevered last night, chills.  No chest pain or shortness of breath.  Worse with palpation.  No aggravating or alleviating factors.    The history is provided by the patient.    Past Medical History  Diagnosis Date  . Hypertension   . Depression   . Anxiety   . Ear infection     Past Surgical History  Procedure Date  . Colonscopy 11/2002  . Upper gastrointestinal endoscopy 11/2002  . Hemorrhoid surgery 08/2004  . Dilation and curettage of uterus 09/2007    hysteroscopy for fibroid  . Breast surgery   . Laparoscopic assisted vaginal hysterectomy 12/07/2011    Procedure: LAPAROSCOPIC ASSISTED VAGINAL HYSTERECTOMY;  Surgeon: Geryl Rankins, MD;  Location: [facility];  Service: Gynecology;  Laterality: N/A;    No family history on file.  History  Substance Use Topics  . Smoking status: Former Games developer  . Smokeless tobacco: Not on file  . Alcohol Use: Yes     pt here with strong etoh odor    OB History    Grav Para Term Preterm Abortions TAB SAB Ect Mult Living                  Review of Systems  All other systems reviewed and are negative.    Allergies  Codeine and Medrol  Home Medications   Current Outpatient Rx  Name Route Sig Dispense Refill  . BUSPIRONE HCL 5 MG PO TABS Oral Take 5 mg by mouth 3 (three) times daily.    . NEOMYCIN-POLYMYXIN-HC 1 % OT SOLN  every 6 (six) hours.    . ASPIRIN EC 81 MG PO TBEC Oral Take 81 mg by mouth daily.    Marland Kitchen CALCIUM 600 + D PO Oral Take 1 tablet by mouth daily.     Marland Kitchen FLUOXETINE HCL 20 MG PO TABS Oral Take 20 mg by  mouth daily.    . ADULT MULTIVITAMIN W/MINERALS CH Oral Take 1 tablet by mouth daily.    . NEBIVOLOL HCL 10 MG PO TABS Oral Take 10 mg by mouth daily.    Marland Kitchen RA FISH OIL 900 MG PO CAPS Oral Take 900 mg by mouth daily.      BP 116/68  Pulse 71  Temp 99.1 F (37.3 C) (Oral)  Resp 18  Ht 5\' 2"  (1.575 m)  Wt 134 lb (60.782 kg)  BMI 24.51 kg/m2  SpO2 100%  Physical Exam  Nursing note and vitals reviewed. Constitutional: She appears well-developed and well-nourished.  HENT:  Head: Normocephalic and atraumatic.  Mouth/Throat: Oropharynx is clear and moist.  Neck: Normal range of motion. Neck supple.  Musculoskeletal: Normal range of motion.  Neurological: She is alert.  Skin:       There is a quarter-sized blistered lesion to the left anterior shin with surrounding erythema.      ED Course  Procedures (including critical care time)  Labs Reviewed - No data to display No results found.   No diagnosis found.    MDM  Will treat with bactrim and keflex, return  prn.        Geoffery Lyons, MD 01/29/12 1426

## 2012-06-11 ENCOUNTER — Encounter (HOSPITAL_COMMUNITY): Payer: Self-pay

## 2012-06-11 ENCOUNTER — Emergency Department (HOSPITAL_COMMUNITY): Payer: BC Managed Care – PPO

## 2012-06-11 ENCOUNTER — Emergency Department (HOSPITAL_COMMUNITY)
Admission: EM | Admit: 2012-06-11 | Discharge: 2012-06-12 | Disposition: A | Discharge status: Home / Self Care | Payer: BC Managed Care – PPO | Attending: Emergency Medicine | Admitting: Emergency Medicine

## 2012-06-11 DIAGNOSIS — Z79899 Other long term (current) drug therapy: Secondary | ICD-10-CM | POA: Insufficient documentation

## 2012-06-11 DIAGNOSIS — Y939 Activity, unspecified: Secondary | ICD-10-CM | POA: Insufficient documentation

## 2012-06-11 DIAGNOSIS — R45851 Suicidal ideations: Secondary | ICD-10-CM

## 2012-06-11 DIAGNOSIS — Z7982 Long term (current) use of aspirin: Secondary | ICD-10-CM | POA: Insufficient documentation

## 2012-06-11 DIAGNOSIS — W19XXXA Unspecified fall, initial encounter: Secondary | ICD-10-CM | POA: Insufficient documentation

## 2012-06-11 DIAGNOSIS — IMO0002 Reserved for concepts with insufficient information to code with codable children: Secondary | ICD-10-CM

## 2012-06-11 DIAGNOSIS — F3289 Other specified depressive episodes: Secondary | ICD-10-CM | POA: Insufficient documentation

## 2012-06-11 DIAGNOSIS — F411 Generalized anxiety disorder: Secondary | ICD-10-CM | POA: Insufficient documentation

## 2012-06-11 DIAGNOSIS — F1010 Alcohol abuse, uncomplicated: Secondary | ICD-10-CM | POA: Insufficient documentation

## 2012-06-11 DIAGNOSIS — Z87891 Personal history of nicotine dependence: Secondary | ICD-10-CM | POA: Insufficient documentation

## 2012-06-11 DIAGNOSIS — Y92009 Unspecified place in unspecified non-institutional (private) residence as the place of occurrence of the external cause: Secondary | ICD-10-CM | POA: Insufficient documentation

## 2012-06-11 DIAGNOSIS — S0990XA Unspecified injury of head, initial encounter: Secondary | ICD-10-CM | POA: Insufficient documentation

## 2012-06-11 DIAGNOSIS — I10 Essential (primary) hypertension: Secondary | ICD-10-CM | POA: Insufficient documentation

## 2012-06-11 DIAGNOSIS — F329 Major depressive disorder, single episode, unspecified: Secondary | ICD-10-CM | POA: Insufficient documentation

## 2012-06-11 MED ORDER — HALOPERIDOL LACTATE 5 MG/ML IJ SOLN
5.0000 mg | Freq: Once | INTRAMUSCULAR | Status: AC
  Administered 2012-06-11: 5 mg via INTRAMUSCULAR
  Filled 2012-06-11: qty 1

## 2012-06-11 NOTE — ED Provider Notes (Signed)
History     CSN: [id number]  Arrival date & time 06/11/12  2047   First MD Initiated Contact with Patient 06/11/12 2214      Chief Complaint  Patient presents with  . Alcohol Intoxication  . Fall  . Head Injury    (Consider location/radiation/quality/duration/timing/severity/associated sxs/prior treatment) HPI Comments: Per family patient has been drinkn gall day has Hx ETOH abuse also was expressing SI thoughts with Hx of multiple attempts in the past. She was uncooperative for EMS and was give both IM and IV Haldol   Patient is a 62 y.o. female presenting with intoxication, fall, and head injury. The history is provided by the patient.  Alcohol Intoxication This is a recurrent problem. The current episode started today. The problem occurs constantly. The problem has been unchanged. Pertinent negatives include no fever, headaches or neck pain. The symptoms are aggravated by drinking. She has tried nothing for the symptoms.  Fall Pertinent negatives include no fever and no headaches.  Head Injury Associated symptoms: no headaches and no neck pain     Past Medical History  Diagnosis Date  . Hypertension   . Depression   . Anxiety   . Ear infection     Past Surgical History  Procedure Laterality Date  . Colonscopy  11/2002  . Upper gastrointestinal endoscopy  11/2002  . Hemorrhoid surgery  08/2004  . Dilation and curettage of uterus  09/2007    hysteroscopy for fibroid  . Breast surgery    . Laparoscopic assisted vaginal hysterectomy  12/07/2011    Procedure: LAPAROSCOPIC ASSISTED VAGINAL HYSTERECTOMY;  Surgeon: Geryl Rankins, MD;  Location: [facility];  Service: Gynecology;  Laterality: N/A;    No family history on file.  History  Substance Use Topics  . Smoking status: Former Games developer  . Smokeless tobacco: Not on file  . Alcohol Use: Yes     Comment: pt here with strong etoh odor    OB History   Grav Para Term Preterm Abortions TAB SAB Ect Mult Living                   Review of Systems  Constitutional: Negative for fever.  HENT: Negative for neck pain.   Skin: Positive for wound.  Neurological: Negative for headaches.  Psychiatric/Behavioral: Positive for suicidal ideas.  All other systems reviewed and are negative.    Allergies  Codeine and Medrol  Home Medications   Current Outpatient Rx  Name  Route  Sig  Dispense  Refill  . aspirin EC 81 MG tablet   Oral   Take 81 mg by mouth daily.         . Calcium Carbonate-Vitamin D (CALCIUM 600 + D PO)   Oral   Take 1 tablet by mouth daily.          . Multiple Vitamin (MULTIVITAMIN WITH MINERALS) TABS   Oral   Take 1 tablet by mouth daily.         . nebivolol (BYSTOLIC) 10 MG tablet   Oral   Take 10 mg by mouth daily.         Marland Kitchen omega-3 acid ethyl esters (LOVAZA) 1 G capsule   Oral   Take 1 g by mouth daily.           BP 130/69  Pulse 66  Temp(Src) 98.8 F (37.1 C) (Oral)  Resp 19  SpO2 97%  Physical Exam  Nursing note and vitals reviewed. Constitutional: She appears well-developed and well-nourished.  HENT:  Head: Normocephalic.  Neck: Normal range of motion.  Cardiovascular: Normal rate.   Pulmonary/Chest: Effort normal.  Musculoskeletal: Normal range of motion.  Neurological: She is alert.  Skin: Skin is warm.  macerated area to posterior scalp with minimal bleeding   Psychiatric: Her affect is angry. Her speech is slurred. She is aggressive. She expresses inappropriate judgment. She expresses suicidal ideation.    ED Course  LACERATION REPAIR Date/Time: 06/12/2012 4:16 AM Performed by: Arman Filter Authorized by: Arman Filter Consent: Verbal consent obtained. Risks and benefits: risks, benefits and alternatives were discussed Consent given by: patient Patient understanding: patient states understanding of the procedure being performed Patient identity confirmed: verbally with patient Time out: Immediately prior to procedure a "time out" was  called to verify the correct patient, procedure, equipment, support staff and site/side marked as required. Body area: head/neck Location details: scalp Laceration length: 1 cm Foreign bodies: no foreign bodies Tendon involvement: none Nerve involvement: none Vascular damage: no Anesthetic total: 0 ml Irrigation solution: saline Amount of cleaning: extensive Debridement: moderate Skin closure: staples Number of sutures: 2 Patient tolerance: Patient tolerated the procedure well with no immediate complications.   (including critical care time)  Labs Reviewed  ETHANOL - Abnormal; Notable for the following:    Alcohol, Ethyl (B) 226 (*)    All other components within normal limits  CBC WITH DIFFERENTIAL - Abnormal; Notable for the following:    Monocytes Relative 14 (*)    Monocytes Absolute 1.1 (*)    All other components within normal limits  COMPREHENSIVE METABOLIC PANEL - Abnormal; Notable for the following:    BUN 4 (*)    Total Bilirubin 0.1 (*)    All other components within normal limits  CBC WITH DIFFERENTIAL   Ct Head Wo Contrast  06/12/2012  *RADIOLOGY REPORT*  Clinical Data: Posterior head injury, unwitnessed fall, ethanol, history hypertension  CT HEAD WITHOUT CONTRAST  Technique:  Contiguous axial images were obtained from the base of the skull through the vertex without contrast.  Comparison: 02/08/2008  Findings: Generalized atrophy. Normal ventricular morphology. No midline shift or mass effect. Extra-axial density right frontal region images 14 and 15 likely represents a vessel and is unchanged from previous exam. No definite intracranial hemorrhage, mass lesion, or evidence of acute infarction. No extra-axial fluid collections definitely visualized. Posterior scalp hematoma with multiple foci of scalp soft tissue gas question laceration. Bones and sinuses unremarkable.  IMPRESSION: No acute intracranial abnormalities.   Original Report Authenticated By: Ulyses Southward,  M.D.      1. Suicidal ideations   2. Intoxication     P  MDM  Patient continues to be uncooperative and combative Hsuband unable to reason with patient will go to magistrate for petition  Patient is medically cleared for transfer to psy unit and evalaution for SI       Arman Filter, NP 06/12/12 0345  Arman Filter, NP 06/12/12 0416  Arman Filter, NP 06/12/12 2014

## 2012-06-11 NOTE — ED Notes (Signed)
Pt refuses CT scan

## 2012-06-11 NOTE — ED Notes (Signed)
Pt refusing at this time. Will reassess

## 2012-06-11 NOTE — ED Notes (Signed)
Pt has stated numerous times that she was refusing her CT scan. Explained to pt the need for pt care and pt continues to refuse. CN notified of pt issue and advised to speak with NP. Husband brought back to bedside for support. Pt still refuses. NP notified. NP, nurse and husband at bedside to talk over care with pt. Family expresses wishes of "going downtown". NP explained options.

## 2012-06-11 NOTE — ED Notes (Signed)
[id number]<AV> Expected date:<BR> Expected time:<BR> Means of arrival:<BR> Comments:<BR> ems

## 2012-06-11 NOTE — ED Notes (Signed)
Per EMS: Pt had an unwitnessed fall at home in the drive way. Pt family reports pt drinking all day and expressing SI. Previous attempts of SI in the past. EMS reports pt being combative on scene and en route. Pt given 2.5mg  Halidol IM then 2.5 mg Halidol IV.

## 2012-06-12 ENCOUNTER — Encounter (HOSPITAL_COMMUNITY): Payer: Self-pay | Admitting: *Deleted

## 2012-06-12 ENCOUNTER — Inpatient Hospital Stay (HOSPITAL_COMMUNITY)
Admission: AD | Admit: 2012-06-12 | Discharge: 2012-06-16 | DRG: 751 | Disposition: A | Discharge status: Home / Self Care | Payer: BC Managed Care – PPO | Source: Intra-hospital | Attending: Psychiatry | Admitting: Psychiatry

## 2012-06-12 DIAGNOSIS — Z79899 Other long term (current) drug therapy: Secondary | ICD-10-CM

## 2012-06-12 DIAGNOSIS — F10239 Alcohol dependence with withdrawal, unspecified: Principal | ICD-10-CM | POA: Diagnosis present

## 2012-06-12 DIAGNOSIS — I10 Essential (primary) hypertension: Secondary | ICD-10-CM | POA: Diagnosis present

## 2012-06-12 DIAGNOSIS — F411 Generalized anxiety disorder: Secondary | ICD-10-CM | POA: Diagnosis present

## 2012-06-12 DIAGNOSIS — F329 Major depressive disorder, single episode, unspecified: Secondary | ICD-10-CM | POA: Diagnosis present

## 2012-06-12 DIAGNOSIS — F1020 Alcohol dependence, uncomplicated: Secondary | ICD-10-CM | POA: Diagnosis present

## 2012-06-12 DIAGNOSIS — F10939 Alcohol use, unspecified with withdrawal, unspecified: Principal | ICD-10-CM | POA: Diagnosis present

## 2012-06-12 LAB — COMPREHENSIVE METABOLIC PANEL
ALT: 15 U/L (ref 0–35)
AST: 26 U/L (ref 0–37)
Albumin: 3.5 g/dL (ref 3.5–5.2)
Alkaline Phosphatase: 52 U/L (ref 39–117)
BUN: 4 mg/dL — ABNORMAL LOW (ref 6–23)
CO2: 27 mEq/L (ref 19–32)
Calcium: 8.6 mg/dL (ref 8.4–10.5)
Chloride: 104 mEq/L (ref 96–112)
Creatinine, Ser: 0.53 mg/dL (ref 0.50–1.10)
GFR calc Af Amer: >90 mL/min (ref >90)
GFR calc non Af Amer: >90 mL/min (ref >90)
Glucose, Bld: 94 mg/dL (ref 70–99)
Potassium: 3.9 mEq/L (ref 3.5–5.1)
Sodium: 142 mEq/L (ref 135–145)
Total Bilirubin: 0.1 mg/dL — ABNORMAL LOW (ref 0.3–1.2)
Total Protein: 7.5 g/dL (ref 6.0–8.3)

## 2012-06-12 LAB — CBC WITH DIFFERENTIAL/PLATELET
Basophils Absolute: 0 10*3/uL (ref 0.0–0.1)
Basophils Relative: 0 % (ref 0–1)
Eosinophils Absolute: 0.1 10*3/uL (ref 0.0–0.7)
Eosinophils Relative: 1 % (ref 0–5)
HCT: 40.7 % (ref 36.0–46.0)
Hemoglobin: 14.1 g/dL (ref 12.0–15.0)
Lymphocytes Relative: 36 % (ref 12–46)
Lymphs Abs: 2.8 10*3/uL (ref 0.7–4.0)
MCH: 31 pg (ref 26.0–34.0)
MCHC: 34.6 g/dL (ref 30.0–36.0)
MCV: 89.5 fL (ref 78.0–100.0)
Monocytes Absolute: 1.1 10*3/uL — ABNORMAL HIGH (ref 0.1–1.0)
Monocytes Relative: 14 % — ABNORMAL HIGH (ref 3–12)
Neutro Abs: 3.8 10*3/uL (ref 1.7–7.7)
Neutrophils Relative %: 48 % (ref 43–77)
Platelets: 279 10*3/uL (ref 150–400)
RBC: 4.55 MIL/uL (ref 3.87–5.11)
RDW: 13 % (ref 11.5–15.5)
WBC: 7.8 10*3/uL (ref 4.0–10.5)

## 2012-06-12 LAB — ETHANOL: Alcohol, Ethyl (B): 226 mg/dL — ABNORMAL HIGH (ref 0–11)

## 2012-06-12 MED ORDER — ONDANSETRON HCL 4 MG PO TABS: 4.0000 mg | ORAL_TABLET | Freq: Three times a day (TID) | ORAL | Status: DC | PRN

## 2012-06-12 MED ORDER — THIAMINE HCL 100 MG/ML IJ SOLN
100.0000 mg | Freq: Once | INTRAMUSCULAR | Status: AC
  Administered 2012-06-12: 100 mg via INTRAMUSCULAR

## 2012-06-12 MED ORDER — LORAZEPAM 1 MG PO TABS
1.0000 mg | ORAL_TABLET | Freq: Three times a day (TID) | ORAL | Status: DC | PRN
  Administered 2012-06-12: 1 mg via ORAL
  Filled 2012-06-12: qty 1

## 2012-06-12 MED ORDER — VITAMIN B-1 100 MG PO TABS
100.0000 mg | ORAL_TABLET | Freq: Every day | ORAL | Status: DC
  Administered 2012-06-13 – 2012-06-16 (×4): 100 mg via ORAL
  Filled 2012-06-12 (×6): qty 1

## 2012-06-12 MED ORDER — ONDANSETRON 4 MG PO TBDP
4.0000 mg | ORAL_TABLET | Freq: Four times a day (QID) | ORAL | Status: AC | PRN
  Filled 2012-06-12: qty 1

## 2012-06-12 MED ORDER — HYDROXYZINE HCL 25 MG PO TABS
25.0000 mg | ORAL_TABLET | Freq: Four times a day (QID) | ORAL | Status: AC | PRN
  Administered 2012-06-14: 25 mg via ORAL

## 2012-06-12 MED ORDER — MAGNESIUM HYDROXIDE 400 MG/5ML PO SUSP
30.0000 mL | Freq: Every day | ORAL | Status: DC | PRN
  Administered 2012-06-13: 30 mL via ORAL

## 2012-06-12 MED ORDER — ADULT MULTIVITAMIN W/MINERALS CH
1.0000 | ORAL_TABLET | Freq: Every day | ORAL | Status: DC
  Administered 2012-06-13 – 2012-06-16 (×4): 1 via ORAL
  Filled 2012-06-12 (×6): qty 1

## 2012-06-12 MED ORDER — FLUOXETINE HCL 20 MG PO CAPS
20.0000 mg | ORAL_CAPSULE | Freq: Every day | ORAL | Status: DC
  Administered 2012-06-13: 20 mg via ORAL
  Filled 2012-06-12 (×3): qty 1

## 2012-06-12 MED ORDER — IBUPROFEN 600 MG PO TABS: 600.0000 mg | ORAL_TABLET | Freq: Three times a day (TID) | ORAL | Status: DC | PRN

## 2012-06-12 MED ORDER — CHLORDIAZEPOXIDE HCL 25 MG PO CAPS
25.0000 mg | ORAL_CAPSULE | Freq: Four times a day (QID) | ORAL | Status: AC | PRN
  Administered 2012-06-14: 25 mg via ORAL
  Filled 2012-06-12 (×3): qty 1

## 2012-06-12 MED ORDER — HALOPERIDOL 5 MG PO TABS
5.0000 mg | ORAL_TABLET | Freq: Two times a day (BID) | ORAL | Status: DC
  Administered 2012-06-12 (×2): 5 mg via ORAL
  Filled 2012-06-12 (×2): qty 1

## 2012-06-12 MED ORDER — LOPERAMIDE HCL 2 MG PO CAPS: 2.0000 mg | ORAL_CAPSULE | ORAL | Status: AC | PRN

## 2012-06-12 MED ORDER — ACETAMINOPHEN 325 MG PO TABS
650.0000 mg | ORAL_TABLET | Freq: Four times a day (QID) | ORAL | Status: DC | PRN
  Administered 2012-06-14: 650 mg via ORAL

## 2012-06-12 MED ORDER — NICOTINE 21 MG/24HR TD PT24
21.0000 mg | MEDICATED_PATCH | Freq: Every day | TRANSDERMAL | Status: DC
  Filled 2012-06-12: qty 1

## 2012-06-12 MED ORDER — ALUM & MAG HYDROXIDE-SIMETH 200-200-20 MG/5ML PO SUSP: 30.0000 mL | ORAL | Status: DC | PRN

## 2012-06-12 NOTE — ED Notes (Signed)
Pt transferred from Main ED, presents for medical clearance, pt IVC with SI, no plan.  Denies at present. Denies HI or AV hallucinations,  Pt intoxicated, fell & hit her head tonight.  NEG LOC.  Pt AAO x 3, calm & cooperative.

## 2012-06-12 NOTE — Progress Notes (Signed)
Pt accepted to [facility] 301-2 Dr. Elsie Saas to Dr. Dub Mikes.   Catha Gosselin, LCSWA  [phone] .06/12/2012 9:14pm

## 2012-06-12 NOTE — ED Notes (Signed)
Patient discharge in the custody of Sheriff. Patient given a blanket to wrap up in due to the fact she had no shoes or coat.

## 2012-06-12 NOTE — Consult Note (Signed)
Reason for Consult: Depression and safety alcohol intoxication on with the symptoms Referring Physician: Dr. Randall Hiss is an 62 y.o. female.  HPI: Patient was seen and chart reviewed. Patient complains of feeling anxious and nervous about the hard treatment plan. Patient reported she had a blackout and fell and hit her head while intoxicated yesterday evening. Patient has been oh made involuntary commitment petition because patient has been refusing to the treatment noncooperative with the EMS and ER staff. Patient has a history of for chronic depression and safety and alcohol abuse versus dependence. Patient lives with her husband who is supportive to her. Reportedly patient has at least 2 suicide attempts in the past with overdose on medications. Patient was a retired Designer, jewellery from [employer] and has concerns about the cost of for treatment. Patient has been to endorses the financial difficulties but want thought to be treated appropriately. Patient has suicidal ideations without any specific plans. Patient by blood alcohol level 226 on arrival. She is a multiple admission to the behavioral health hospital the past.  MSE: Patient stated mood was anxious and affect was appropriate and she has a fine tremulous on her extended upper extremities. Patient has normal rate rhythm and volume of speech and thought process linear and goal-directed. Patient has a poor insight judgment and impulse control.  Past Medical History  Diagnosis Date  . Hypertension   . Depression   . Anxiety   . Ear infection     Past Surgical History  Procedure Laterality Date  . Colonscopy  11/2002  . Upper gastrointestinal endoscopy  11/2002  . Hemorrhoid surgery  08/2004  . Dilation and curettage of uterus  09/2007    hysteroscopy for fibroid  . Breast surgery    . Laparoscopic assisted vaginal hysterectomy  12/07/2011    Procedure: LAPAROSCOPIC ASSISTED VAGINAL HYSTERECTOMY;  Surgeon: Geryl Rankins, MD;  Location: [facility];  Service: Gynecology;  Laterality: N/A;    No family history on file.  Social History:  reports that she has quit smoking. She does not have any smokeless tobacco history on file. She reports that  drinks alcohol. She reports that she does not use illicit drugs.  Allergies:  Allergies  Allergen Reactions  . Codeine Nausea Only  . Medrol (Methylprednisolone) Rash    Medications: I have reviewed the patient's current medications.  Results for orders placed during the hospital encounter of 06/11/12 (from the past 48 hour(s))  ETHANOL     Status: Abnormal   Collection Time    06/12/12  1:35 AM      Result Value Range   Alcohol, Ethyl (B) 226 (*) 0 - 11 mg/dL   Comment:            LOWEST DETECTABLE LIMIT FOR     SERUM ALCOHOL IS 11 mg/dL     FOR MEDICAL PURPOSES ONLY  CBC WITH DIFFERENTIAL     Status: Abnormal   Collection Time    06/12/12  1:35 AM      Result Value Range   WBC 7.8  4.0 - 10.5 K/uL   RBC 4.55  3.87 - 5.11 MIL/uL   Hemoglobin 14.1  12.0 - 15.0 g/dL   HCT 01.0  27.2 - 53.6 %   MCV 89.5  78.0 - 100.0 fL   MCH 31.0  26.0 - 34.0 pg   MCHC 34.6  30.0 - 36.0 g/dL   RDW 64.4  03.4 - 74.2 %  Platelets 279  150 - 400 K/uL   Neutrophils Relative 48  43 - 77 %   Neutro Abs 3.8  1.7 - 7.7 K/uL   Lymphocytes Relative 36  12 - 46 %   Lymphs Abs 2.8  0.7 - 4.0 K/uL   Monocytes Relative 14 (*) 3 - 12 %   Monocytes Absolute 1.1 (*) 0.1 - 1.0 K/uL   Eosinophils Relative 1  0 - 5 %   Eosinophils Absolute 0.1  0.0 - 0.7 K/uL   Basophils Relative 0  0 - 1 %   Basophils Absolute 0.0  0.0 - 0.1 K/uL  COMPREHENSIVE METABOLIC PANEL     Status: Abnormal   Collection Time    06/12/12  1:35 AM      Result Value Range   Sodium 142  135 - 145 mEq/L   Potassium 3.9  3.5 - 5.1 mEq/L   Chloride 104  96 - 112 mEq/L   CO2 27  19 - 32 mEq/L   Glucose, Bld 94  70 - 99 mg/dL   BUN 4 (*) 6 - 23 mg/dL   Creatinine, Ser 0.27  0.50 - 1.10 mg/dL   Calcium  8.6  8.4 - 25.3 mg/dL   Total Protein 7.5  6.0 - 8.3 g/dL   Albumin 3.5  3.5 - 5.2 g/dL   AST 26  0 - 37 U/L   ALT 15  0 - 35 U/L   Alkaline Phosphatase 52  39 - 117 U/L   Total Bilirubin 0.1 (*) 0.3 - 1.2 mg/dL   GFR calc non Af Amer >90  >90 mL/min   GFR calc Af Amer >90  >90 mL/min   Comment:            The eGFR has been calculated     using the CKD EPI equation.     This calculation has not been     validated in all clinical     situations.     eGFR's persistently     <90 mL/min signify     possible Chronic Kidney Disease.    Ct Head Wo Contrast  06/12/2012  *RADIOLOGY REPORT*  Clinical Data: Posterior head injury, unwitnessed fall, ethanol, history hypertension  CT HEAD WITHOUT CONTRAST  Technique:  Contiguous axial images were obtained from the base of the skull through the vertex without contrast.  Comparison: 02/08/2008  Findings: Generalized atrophy. Normal ventricular morphology. No midline shift or mass effect. Extra-axial density right frontal region images 14 and 15 likely represents a vessel and is unchanged from previous exam. No definite intracranial hemorrhage, mass lesion, or evidence of acute infarction. No extra-axial fluid collections definitely visualized. Posterior scalp hematoma with multiple foci of scalp soft tissue gas question laceration. Bones and sinuses unremarkable.  IMPRESSION: No acute intracranial abnormalities.   Original Report Authenticated By: Ulyses Southward, M.D.     Positive for anxiety, bad mood, depression, excessive alcohol consumption and sleep disturbance Blood pressure 119/66, pulse 79, temperature 98.5 F (36.9 C), temperature source Oral, resp. rate 20, SpO2 95.00%.   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 Patient  benefit from the Librium protocol and adjusting her medication for anxiety and depression during her stay.  JONNALAGADDA,JANARDHAHA R. 06/12/2012, 4:20 PM

## 2012-06-12 NOTE — BHH Counselor (Signed)
3/11 Patient referred to [facility], [facility], [facility], and [facility].  3/11 No beds at [place], [place], [place], [place], [place], [place],  [address], [place], [place], [place] not taking referrals until 14th.

## 2012-06-12 NOTE — ED Notes (Signed)
One bag in locker 37 

## 2012-06-12 NOTE — BHH Counselor (Signed)
Disposition pending telepsych or consult by Dr. Shela Commons.

## 2012-06-12 NOTE — BH Assessment (Signed)
Assessment Note   Amanda [name] is an 62 y.o. female, retired Charity fundraiser, who presents to [facility] by husband for ETOH intoxication.  Per IVC paperwork, husband reports hx attempts of suicide x2.  Pt denies current SI/HI.  Pt also denies any past SI or attempts.  Pt denies AHVH/D.    Pt states that she drinks approximately once per week and when she does she gets drunk.  This time she fell down and sustained injury to the back of her head requiring staples.  Pt states her drink of choice is vodka and drank approximately one pint last evening.    Pt aware of the dangerousness of her drinking.  Pt states she does not want treatment.  Pt admits to having prior treatment in 2013 at [facility] for approximately one week.  Pt reports no change since that treatment.    Pt reports sx of depression: anhedonia, feelings of worthlessness, decreased mood- pt states "this is why I drink".  Pt stated that she was not on any home medications for depression.  Pt states that she wants to return home.    CSW attempted to contact husband, Kathlene November at [phone], received vm, CSW left message for return call.    CSW will defer disposition to psychiatrist.    Axis I: Depressive Disorder NOS Axis II: Deferred Axis III:  Past Medical History  Diagnosis Date  . Hypertension   . Depression   . Anxiety   . Ear infection    Axis IV: other psychosocial or environmental problems Axis V: 51-60 moderate symptoms  Past Medical History:  Past Medical History  Diagnosis Date  . Hypertension   . Depression   . Anxiety   . Ear infection     Past Surgical History  Procedure Laterality Date  . Colonscopy  11/2002  . Upper gastrointestinal endoscopy  11/2002  . Hemorrhoid surgery  08/2004  . Dilation and curettage of uterus  09/2007    hysteroscopy for fibroid  . Breast surgery    . Laparoscopic assisted vaginal hysterectomy  12/07/2011    Procedure: LAPAROSCOPIC ASSISTED VAGINAL HYSTERECTOMY;  Surgeon: Geryl Rankins, MD;   Location: [facility];  Service: Gynecology;  Laterality: N/A;    Family History: No family history on file.  Social History:  reports that she has quit smoking. She does not have any smokeless tobacco history on file. She reports that  drinks alcohol. She reports that she does not use illicit drugs.  Additional Social History:     CIWA: CIWA-Ar BP: 119/66 mmHg Pulse Rate: 79 COWS:    Allergies:  Allergies  Allergen Reactions  . Codeine Nausea Only  . Medrol (Methylprednisolone) Rash    Home Medications:  (Not in a hospital admission)  OB/GYN Status:  No LMP recorded. Patient has had a hysterectomy.  General Assessment Data Location of Assessment: [facility] ACT Assessment: Yes Living Arrangements: Spouse/significant other Can pt return to current living arrangement?: Yes Admission Status: Involuntary Is patient capable of signing voluntary admission?: Yes Transfer from: Home Referral Source: Self/Family/Friend  Education Status Is patient currently in school?: No Current Grade: n/a Highest grade of school patient has completed: n/a Name of school: n/a Contact person: n/a  Risk to self Suicidal Ideation: No Suicidal Intent: No Is patient at risk for suicide?: No Suicidal Plan?: No Access to Means: No (no means reported) What has been your use of drugs/alcohol within the last 12 months?:  (pt reports to drinking once per week) Previous Attempts/Gestures:  No How many times?:  (0) Other Self Harm Risks:  (drinking excessively) Triggers for Past Attempts: Other (Comment) (no hx of attempts) Intentional Self Injurious Behavior: None Family Suicide History: No Recent stressful life event(s):  (none reported) Persecutory voices/beliefs?: No Depression: Yes Depression Symptoms: Isolating;Fatigue;Guilt;Loss of interest in usual pleasures;Feeling worthless/self pity;Feeling angry/irritable Substance abuse history and/or treatment for substance abuse?: Yes Suicide prevention  information given to non-admitted patients: Not applicable  Risk to Others Homicidal Ideation: No Thoughts of Harm to Others: No Current Homicidal Intent: No Current Homicidal Plan: No Access to Homicidal Means: No Identified Victim:  (none) History of harm to others?: No Assessment of Violence: None Noted Violent Behavior Description:  (none reported or noted) Does patient have access to weapons?: No Criminal Charges Pending?: No Does patient have a court date: No  Psychosis Hallucinations: None noted Delusions: None noted  Mental Status Report Appear/Hygiene: Disheveled Eye Contact: Fair Motor Activity: Freedom of movement Speech: Logical/coherent Level of Consciousness: Quiet/awake Mood: Depressed Affect: Depressed Anxiety Level: Moderate Thought Processes: Coherent;Relevant Judgement: Unimpaired (impaired upon arrival- ETOH intoxication) Orientation: Person;Place;Time;Situation;Appropriate for developmental age Obsessive Compulsive Thoughts/Behaviors: None  Cognitive Functioning Concentration: Normal Memory: Recent Intact;Remote Intact IQ: Average Insight: Fair Impulse Control: Fair Appetite: Good Weight Loss:  (none reported) Weight Gain:  (none reported) Sleep: No Change Total Hours of Sleep: 7 Vegetative Symptoms: None  ADLScreening [facility] Assessment Services) Patient's cognitive ability adequate to safely complete daily activities?: Yes Patient able to express need for assistance with ADLs?: Yes Independently performs ADLs?: Yes (appropriate for developmental age)  Abuse/Neglect [facility]) Physical Abuse: Denies Verbal Abuse: Denies Sexual Abuse: Denies  Prior Inpatient Therapy Prior Inpatient Therapy: Yes Prior Therapy Dates:  (October 2013) Prior Therapy Facilty/Bobbie Valletta(s):  ([facility]) Reason for Treatment:  (rehab)  Prior Outpatient Therapy Prior Outpatient Therapy: Yes Prior Therapy Dates:  (unknown) Prior Therapy Facilty/Aladdin Kollmann(s):   (unknwon) Reason for Treatment:  (therapy)  ADL Screening (condition at time of admission) Patient's cognitive ability adequate to safely complete daily activities?: Yes Patient able to express need for assistance with ADLs?: Yes Independently performs ADLs?: Yes (appropriate for developmental age)       Abuse/Neglect Assessment (Assessment to be complete while patient is alone) Physical Abuse: Denies Verbal Abuse: Denies Sexual Abuse: Denies Exploitation of patient/patient's resources: Denies Self-Neglect: Denies Values / Beliefs Cultural Requests During Hospitalization: None Consults Spiritual Care Consult Needed: No Social Work Consult Needed: No      Additional Information 1:1 In Past 12 Months?: No CIRT Risk: No Elopement Risk: No Does patient have medical clearance?: Yes  Child/Adolescent Assessment Running Away Risk:  (n/a) Bed-Wetting:  (n/a) Destruction of Property:  (n/a) Cruelty to Animals:  (n/a) Stealing:  (n/a) Rebellious/Defies Authority:  (n/a) Satanic Involvement:  (n/a) Fire Setting:  (n/a) Problems at School:  (n/a) Gang Involvement:  (n/a)  Disposition:  Disposition Initial Assessment Completed: Yes Disposition of Patient: Referred to Patient referred to: Other (Comment) (To be assessed by psychiatrist before determine disposition)  On Site Evaluation by:   Reviewed with Physician:     Rondel Baton 06/12/2012 11:55 AM

## 2012-06-12 NOTE — ED Notes (Signed)
Pt still refusing blood at this time

## 2012-06-12 NOTE — ED Notes (Signed)
Patient posterior head has been assessed today several times today; patient has no drainage and patient has some complaints of her hurting and when offered interventions she refused; patient wound is dry and intact with the staples in place; site is not inflammed or sore to touch for the patient

## 2012-06-12 NOTE — Clinical Social Work Note (Signed)
CSW met with pt at bedside. Pt gave verbal consent for CSW to speak to husband re: medical care, past and present along with disposition planning.  CSW attempted to call pt husband, Kathlene November at [phone]- no answer.  CSW left message for husband to return call.  Full assessment to follow.  Vickii Penna, LCSWA [phone]  Clinical Social Work

## 2012-06-13 ENCOUNTER — Encounter (HOSPITAL_COMMUNITY): Payer: Self-pay | Admitting: Psychiatry

## 2012-06-13 DIAGNOSIS — F339 Major depressive disorder, recurrent, unspecified: Secondary | ICD-10-CM

## 2012-06-13 DIAGNOSIS — F10939 Alcohol use, unspecified with withdrawal, unspecified: Principal | ICD-10-CM | POA: Diagnosis present

## 2012-06-13 DIAGNOSIS — F1020 Alcohol dependence, uncomplicated: Secondary | ICD-10-CM

## 2012-06-13 DIAGNOSIS — F329 Major depressive disorder, single episode, unspecified: Secondary | ICD-10-CM | POA: Diagnosis present

## 2012-06-13 DIAGNOSIS — F411 Generalized anxiety disorder: Secondary | ICD-10-CM

## 2012-06-13 DIAGNOSIS — F10239 Alcohol dependence with withdrawal, unspecified: Principal | ICD-10-CM | POA: Diagnosis present

## 2012-06-13 MED ORDER — CHLORDIAZEPOXIDE HCL 25 MG PO CAPS
25.0000 mg | ORAL_CAPSULE | Freq: Once | ORAL | Status: AC
  Administered 2012-06-13: 25 mg via ORAL

## 2012-06-13 MED ORDER — CHLORDIAZEPOXIDE HCL 25 MG PO CAPS
25.0000 mg | ORAL_CAPSULE | Freq: Every day | ORAL | Status: AC
  Administered 2012-06-16: 25 mg via ORAL
  Filled 2012-06-13: qty 1

## 2012-06-13 MED ORDER — CHLORDIAZEPOXIDE HCL 25 MG PO CAPS
25.0000 mg | ORAL_CAPSULE | ORAL | Status: AC
  Administered 2012-06-15 (×2): 25 mg via ORAL
  Filled 2012-06-13 (×2): qty 1

## 2012-06-13 MED ORDER — CHLORDIAZEPOXIDE HCL 25 MG PO CAPS
25.0000 mg | ORAL_CAPSULE | Freq: Four times a day (QID) | ORAL | Status: AC
  Administered 2012-06-13 (×4): 25 mg via ORAL
  Filled 2012-06-13 (×3): qty 1

## 2012-06-13 MED ORDER — CHLORDIAZEPOXIDE HCL 25 MG PO CAPS
25.0000 mg | ORAL_CAPSULE | Freq: Three times a day (TID) | ORAL | Status: AC
  Administered 2012-06-14 (×3): 25 mg via ORAL
  Filled 2012-06-13 (×3): qty 1

## 2012-06-13 NOTE — Tx Team (Signed)
Interdisciplinary Treatment Plan Update (Adult)  Date: 06/13/2012  Time Reviewed: 9:51 AM  Progress in Treatment:  Attending groups: Yes Participating in groups: Yes  Taking medication as prescribed: Yes  Tolerating medication: Yes  Family/Significant othe contact made: SW Intern has called pt husband with no response. Will continue to attempt to contact.  Patient understands diagnosis: Yes  Discussing patient identified problems/goals with staff: Yes  Medical problems stabilized or resolved: No  Denies suicidal/homicidal ideation: Yes  Patient has not harmed self or Others: Yes  New problem(s) identified: None Identified  Discharge Plan or Barriers: Pt desires follow up with therapy. MSW Intern will refer to [facility] [place]. Additional comments: Pt started on Prozac by MD. Reason for Continuation of Hospitalization:  Anxiety  Depression  Medication stabilization   Estimated length of stay: 3-5 days  For review of initial/current patient goals, please see plan of care.  Attendees:  Patient:    Family:    Physician: Geoffery Lyons  06/13/2012 4:33 PM   Nursing:     06/13/2012 4:33 PM   Other: Foye Clock, MSW Intern  06/13/2012 4:33 PM   Other: Robbie Louis, RN  06/13/2012 4:33 PM   Other: Concha Se, [place] PA Student  06/13/2012 4:33 PM   Other: Leland Johns Psych Intern  06/13/2012 4:33 PM   Scribe for Treatment Team:  Rico Ala 06/13/2012 4:36 PM

## 2012-06-13 NOTE — Progress Notes (Signed)
Date: 06/13/2012  Time: 11;00am  Group Topic/Focus:  Personal Choices and Values: The focus of this group is to help patients assess and explore the importance of values in their lives, how their values affect their decisions, how they express their values and what opposes their expression.  Participation Level: Did Not Attend  Participation Quality:  Affect:  Cognitive:  Insight:  Engagement in Group:  Modes of Intervention:  Additional Comments: Pt refused to come. Pt has been in the bed all day.  Amanda [name] M  06/13/2012, 1:05 PM

## 2012-06-13 NOTE — Progress Notes (Signed)
Attempted contact with pt husband with no answer, will follow up.  Solon Alban L 06/13/2012 3:37 PM

## 2012-06-13 NOTE — [facility] Suicide Risk Assessment (Signed)
Suicide Risk Assessment  Admission Assessment     Nursing information obtained from:  Patient Demographic factors:  Caucasian;Unemployed Current Mental Status:  NA Loss Factors:  Financial problems / change in socioeconomic status Historical Factors:  Family history of mental illness or substance abuse Risk Reduction Factors:     CLINICAL FACTORS:   Depression:   Comorbid alcohol abuse/dependence Alcohol/Substance Abuse/Dependencies  COGNITIVE FEATURES THAT CONTRIBUTE TO RISK:  Closed-mindedness Thought constriction (tunnel vision)    SUICIDE RISK:   Moderate:  Frequent suicidal ideation with limited intensity, and duration, some specificity in terms of plans, no associated intent, good self-control, limited dysphoria/symptomatology, some risk factors present, and identifiable protective factors, including available and accessible social support.  PLAN OF CARE: Supportive approach/coping skills/relapse prevention                               Address the co morbidities  I certify that inpatient services furnished can reasonably be expected to improve the patient's condition.  LUGO,IRVING A 06/13/2012, 3:50 PM

## 2012-06-13 NOTE — Progress Notes (Signed)
Adult Psychoeducational Group Note  Date:  06/13/2012 Time:  8:44 PM  Group Topic/Focus:  AA group  Participation Level:  Active  Participation Quality:  Appropriate  Affect:  Appropriate  Cognitive:  Alert  Insight: Appropriate  Engagement in Group:  Improving  Modes of Intervention:  Discussion  Additional Comments:    Flonnie Hailstone 06/13/2012, 8:44 PM

## 2012-06-13 NOTE — Progress Notes (Signed)
[facility] LCSW Aftercare Discharge Planning Group Note  06/13/2012 10:21 AM  Participation Quality:  Did not attend   Amanda [name], Amanda [name] 06/13/2012, 10:21 AM

## 2012-06-13 NOTE — Progress Notes (Signed)
This is a 62 years old Caucasian female involuntarily committed by her husband for alcohol intoxication. She reported long history of alcohol abuse dated back to age 38. She said that she started as a social drinker and over the years, she became addicted, drinking up to 1 pint of Vodka at once; her blood alcohol level on admission was 226. She reported that her children and husband are tired of her alcohol problem and they no longer care about her; "nobody wants me anymore". 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 According to ED report, patient got intoxicated 2 days ago, fell and hit the back of her head; she has 2 staples due to that. Patient appeared sad and depressed during this assessment, withdrawal symptoms includes mild headache, tremors and sweating, V/S within normal limit. She denied SI/HI and denied Hallucinations. Her thought process within normal limit and her speech logical and coherent. Admission assessment completed, patient oriented to the unit and Q 15 minute check initiated.
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
## 2012-06-13 NOTE — Progress Notes (Signed)
Patient ID: Amanda [name], female   DOB: 13-Jul-1950, 62 y.o.   MRN: [id number] She has been up and to groups interacting with peers and staff. She denies having withdrawal symptoms. Self Inventory: depression and hopelessness at 7, some agitation, denies SI thoughts.

## 2012-06-13 NOTE — Progress Notes (Signed)
[facility] LCSW Group Therapy  06/13/2012 3:18 PM  Type of Therapy:  Group Therapy  Participation Level:  Minimal  Participation Quality:  Appropriate  Affect:  Blunted, Depressed and Flat  Cognitive:  Alert and Appropriate  Insight:  Limited  Engagement in Therapy:  Limited  Modes of Intervention: Discussion, Education, Rapport Building and Support   Summary of Progress/Problems:Todays group topic was Emotion Regulation. Pts participated in  activity where they chose two photographs provided by SW Intern, one that represented a positive emotion and one that represented a negative emotion. Pts processed ways to cope with negative emotions and behaviors that they could engage in to evoke their positive emotions.   Pts affect was depressed and flat.  Her posture was closed, she laid on the couch wrapped in her blanket with her knees drawn to her chest and head resting on the armrest during group.  Pt chose a picture of an older couple to represent her positive emotion of love and security that she shares she feels with her husband.  Pt chose a picture of women looking "sad and overwhelmed."  Pt identified being overwhelmed as a trigger for her binge drinking.  She also shared that her drinking has been detrimental to her marriage.  Pt processed that making lists was a way to avoid becoming overwhelmed.

## 2012-06-13 NOTE — ED Provider Notes (Signed)
  Medical screening examination/treatment/procedure(s) were performed by non-physician practitioner and as supervising physician I was immediately available for consultation/collaboration.    Gerhard Munch, MD 06/13/12 [phone]
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
## 2012-06-13 NOTE — Tx Team (Signed)
Initial Interdisciplinary Treatment Plan  PATIENT STRENGTHS: (choose at least two) Ability for insight Average or above average intelligence Physical Health Supportive family/friends  PATIENT STRESSORS: Financial difficulties Substance abuse   PROBLEM LIST: Problem List/Patient Goals Date to be addressed Date deferred Reason deferred Estimated date of resolution  Alcohol Abuse 06/13/12                                                      DISCHARGE CRITERIA:  Improved stabilization in mood, thinking, and/or behavior Motivation to continue treatment in a less acute level of care Verbal commitment to aftercare and medication compliance Withdrawal symptoms are absent or subacute and managed without 24-hour nursing intervention  PRELIMINARY DISCHARGE PLAN: Attend PHP/IOP Attend 12-step recovery group Outpatient therapy Return to previous living arrangement  PATIENT/FAMIILY INVOLVEMENT: This treatment plan has been presented to and reviewed with the patient, Amanda [name], and/or family member.  The patient and family have been given the opportunity to ask questions and make suggestions.  Roselie Skinner [facility] 06/13/2012, 12:17 AM

## 2012-06-14 NOTE — Progress Notes (Signed)
[facility] LCSW Aftercare Discharge Planning Group Note  06/14/2012 12:26 PM  Participation Quality:  Appropriate and Attentive  Affect:  Depressed  Cognitive:  Alert and Appropriate  Insight:  Improving  Engagement in Group:  Engaged  Modes of Intervention:  Education, Dentist, Scientist, physiological of Progress/Problems: Pt attended group. Her affect was depressed but brighter than it had been the previous day. Pt posture is open, she is sitting in the chair facing speaker and attentive.  Pt rates depression at five, anxiety at six, hopelessness and helplessness at three.  Pt shared that she is feeling "better" today and that she was happy that she had had the opportunity to speak with her husband. Pt disclosed that she will be returning home with her husband following dc.    Bournes, Roshelle L 06/14/2012, 12:26 PM

## 2012-06-14 NOTE — Progress Notes (Signed)
Adult Psychoeducational Group Note  Date:  06/14/2012 Time:  7:23 PM  Group Topic/Focus:  Overcoming Stress:   The focus of this group is to define stress and help patients assess their triggers.  Participation Level:  Active  Participation Quality:  Appropriate  Affect:  Appropriate  Cognitive:  Appropriate  Insight: Appropriate  Engagement in Group:  Engaged  Modes of Intervention:  Discussion, Education and Support  Additional Comments:  Pt actively participated in group discussion on stress, including triggers for stress, physiological signs that one is stressed, and positive coping skills for dealing with stress. Pt shared numerous coping skills he uses in order to cope with feelings of stress.    Reinaldo Raddle K 06/14/2012, 7:23 PM

## 2012-06-14 NOTE — Progress Notes (Signed)
SW Intern made contact with pt husband to gather collateral information for pt.  He is supportive of her treatment and follow up. He is concerned that she is not invested in her treatment and will relapse again.  SW informed him that recovery is a process and that the purpose of acute care is to stabilize pts in order to strengthen coping skills and prepare them for more long term treatment following dc. Pt husband verbalized that he understood.

## 2012-06-14 NOTE — Progress Notes (Signed)
Patient ID: Amanda [name], female   DOB: 1950/06/03, 62 y.o.   MRN: [id number] She has been up and to groups interacting more with peers and staff. Self inventory:  Depressed 6, hopelessness 6, and denies SI thoughts. She has been up more today and talking to peoples more today.

## 2012-06-14 NOTE — Progress Notes (Signed)
[facility] MD Progress Note  06/14/2012 5:20 PM Amanda [name]  MRN:  [id number] Subjective:  Leland's affect is very labile today. Tearful. Expressess a lot of shame and guilt for what he has made her family go through with her alcoholism. She is concerned about the monetary cost of the follow up. States that she owes a lot of money for the place she has been what increases her sense of shame and guilt. She admits that she has not been compliant with medications and that depression is usually the trigger for her relapses Diagnosis:  Alcohol dependence/withdrawal, major depression recurrent, GAD  ADL's:  Intact  Sleep: Fair  Appetite:  Poor  Suicidal Ideation:  Plan:  denies Intent:  denies Means:  denies Homicidal Ideation:  Plan:  denies Intent:  denies Means:  denies AEB (as evidenced by):  Psychiatric Specialty Exam: Review of Systems  Constitutional: Negative.   HENT: Negative.   Eyes: Negative.   Respiratory: Negative.   Cardiovascular: Negative.   Gastrointestinal: Negative.   Genitourinary: Negative.   Musculoskeletal: Negative.   Skin: Negative.   Neurological: Positive for tremors.  Endo/Heme/Allergies: Negative.   Psychiatric/Behavioral: Positive for depression and substance abuse. The patient is nervous/anxious and has insomnia.     Blood pressure 103/70, pulse 59, temperature 98.1 F (36.7 C), temperature source Oral, resp. rate 16, height 5\' 2"  (1.575 m).There is no weight on file to calculate BMI.  General Appearance: Disheveled  Eye Solicitor::  Fair  Speech:  Clear and Coherent and Slow  Volume:  Decreased  Mood:  Anxious and Depressed  Affect:  Depressed and Tearful  Thought Process:  Coherent and Goal Directed  Orientation:  Full (Time, Place, and Person)  Thought Content:  worries, concerns, shame and guilt  Suicidal Thoughts:  No  Homicidal Thoughts:  No  Memory:  Immediate;   Fair Recent;   Fair Remote;   Fair  Judgement:  Fair  Insight:  Present   Psychomotor Activity:  Restlessness  Concentration:  Fair  Recall:  Fair  Akathisia:  No  Handed:  Right  AIMS (if indicated):     Assets:  Desire for Improvement  Sleep:  Number of Hours: 6.5   Current Medications: Current Facility-Administered Medications  Medication Dose Route Frequency Provider Last Rate Last Dose  . acetaminophen (TYLENOL) tablet 650 mg  650 mg Oral Q6H PRN [name], MD      . alum & mag hydroxide-simeth (MAALOX/MYLANTA) 200-200-20 MG/5ML suspension 30 mL  30 mL Oral Q4H PRN [name], MD      . chlordiazePOXIDE (LIBRIUM) capsule 25 mg  25 mg Oral Q6H PRN [name], MD      . Melene Muller ON 06/15/2012] chlordiazePOXIDE (LIBRIUM) capsule 25 mg  25 mg Oral BH-qamhs [name], MD       Followed by  . [START ON 06/16/2012] chlordiazePOXIDE (LIBRIUM) capsule 25 mg  25 mg Oral Daily [name], MD      . hydrOXYzine (ATARAX/VISTARIL) tablet 25 mg  25 mg Oral Q6H PRN [name], MD      . loperamide (IMODIUM) capsule 2-4 mg  2-4 mg Oral PRN [name], MD      . magnesium hydroxide (MILK OF MAGNESIA) suspension 30 mL  30 mL Oral Daily PRN [name], MD   30 mL at 06/13/12 1642  . multivitamin with minerals tablet 1 tablet  1 tablet Oral Daily [name], MD   1  tablet at 06/14/12 0823  . ondansetron (ZOFRAN-ODT) disintegrating tablet 4 mg  4 mg Oral Q6H PRN [name], MD      . thiamine (VITAMIN B-1) tablet 100 mg  100 mg Oral Daily [name], MD   100 mg at 06/14/12 5409    Lab Results: No results found for this or any previous visit (from the past 48 hour(s)).  Physical Findings: AIMS: Facial and Oral Movements Muscles of Facial Expression: None, normal Lips and Perioral Area: None, normal Jaw: None, normal Tongue: None, normal,Extremity Movements Upper (arms, wrists, hands, fingers): None, normal Lower (legs, knees,  ankles, toes): None, normal, Trunk Movements Neck, shoulders, hips: None, normal, Overall Severity Severity of abnormal movements (highest score from questions above): None, normal Incapacitation due to abnormal movements: None, normal Patient's awareness of abnormal movements (rate only patient's report): No Awareness, Dental Status Current problems with teeth and/or dentures?: No Does patient usually wear dentures?: No  CIWA:  CIWA-Ar Total: 0 COWS:  COWS Total Score: 5  Treatment Plan Summary: Daily contact with patient to assess and evaluate symptoms and progress in treatment Medication management  Plan: Supportive approach/coping skills/relapse prevention           Continue Librium detox protocol           Address the depression  Medical Decision Making Problem Points:  Review of psycho-social stressors (1) Data Points:  Review of medication regiment & side effects (2)  I certify that inpatient services furnished can reasonably be expected to improve the patient's condition.   Ronit Marczak A 06/14/2012, 5:20 PM

## 2012-06-14 NOTE — Progress Notes (Signed)
Recreation Therapy Notes  Date: 03.13.2014  Time: 3:00pm  Location: [address] Day Room   Group Topic/Focus: Leisure Education   Participation Level:  Active   Participation Quality:  Appropriate   Affect:  Euthymic  Cognitive:  Appropriate   Additional Comments: Patient viewed informational video on QiGong. Patient chose to participate in instructional portion of video shown. Patient participated from a seated position. Patient participated in discussion about using recreation and leisure as a coping mechanism. Patient stated she likes to cook. Patient stated she enjoys cooking but really loves feeding her husband. Patient state a benefit of using recreation and leisure as a coping mechanism is that it "de-stresses" you. LRT encouraged patient to use cooking as a coping mechanism post D/C.   [name], LRT/CTRS   Jearl Klinefelter 06/14/2012 4:37 PM

## 2012-06-14 NOTE — Progress Notes (Signed)
[facility] LCSW Group Therapy  06/14/2012 6:06 PM  Type of Therapy:  Group Therapy  Participation Level:  Active  Participation Quality:  Appropriate and Attentive  Affect:  Depressed and Tearful  Cognitive:  Alert and Appropriate  Insight:  Limited  Engagement in Therapy:  Engaged  Modes of Intervention: Discussion, Education, Problem-solving, Rapport Building and Support  Summary of Progress/Problems: Pt attended psychoeducation group. Today's theme was "Living a Balanced Life." Pts processed what balance means to them, what ways help up find balance, and what things make [place] lose balance. Pts also took turns discussing things that they would like to let go of and things they would like to hold on to in order to maintain balance in their lives.   Pt shared that she would like to let go of her feelings of becoming overwhelmed and to hold on the her marriage.  Pt shared that she becomes fixated on cleaning her home and becomes overwhelmed when she can not get it all done which triggers her drinking episodes.  Pt became tearful when disclosing her desire to feel useful.  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  Pt was unable to process the need for self care.  She lacks insight as to the secondary nature of her clean home to her health.    SW Intern met with pt individual to discuss conversation with husband.  She is favorable to treatment options.  Pt became tearful during session while discussing her desire to feel needed. Pt lacks insight about the need for her to seek treatment to resolve her SA and depressive symptoms.  [name], Amanda L 06/14/2012, 6:06 PM

## 2012-06-14 NOTE — Progress Notes (Signed)
D: Pt in bed resting with eyes closed. Respirations even and unlabored. Pt appears to be in no signs of distress at this time. A: Q15min checks remains for this pt. R: Pt remains safe at this time.   

## 2012-06-15 MED ORDER — DULOXETINE HCL 30 MG PO CPEP
30.0000 mg | ORAL_CAPSULE | Freq: Every day | ORAL | Status: DC
  Administered 2012-06-15 – 2012-06-16 (×2): 30 mg via ORAL
  Filled 2012-06-15: qty 1
  Filled 2012-06-15: qty 14
  Filled 2012-06-15 (×2): qty 1

## 2012-06-15 MED ORDER — GABAPENTIN 100 MG PO CAPS
100.0000 mg | ORAL_CAPSULE | Freq: Three times a day (TID) | ORAL | Status: DC
  Administered 2012-06-15 – 2012-06-16 (×4): 100 mg via ORAL
  Filled 2012-06-15 (×3): qty 1
  Filled 2012-06-15 (×2): qty 42
  Filled 2012-06-15: qty 1
  Filled 2012-06-15: qty 42
  Filled 2012-06-15 (×2): qty 1

## 2012-06-15 NOTE — Progress Notes (Signed)
D:  Amanda [name] reports that she slept well and her appetite is improving.  Her energy level is low and her ability to pay attention is improving.  She is rating her depression at 5/10 and hopelessness at 4/10.  She denies SI/HI/AVH at this time, but states that she is having some anxiety related to withdrawals.  She is attending groups and is interacting appropriately with staff and other patients.  She states that she feels better today than she did yesterday and is brighter in appearance. A:  Medications administered as ordered.  Safety checks q 15 minutes. Emotional support provided. R:  Safety maintained on unit.

## 2012-06-15 NOTE — Progress Notes (Signed)
[facility] Adult Case Management Discharge Plan :  Will you be returning to the same living situation after discharge: Yes,  home with husband At discharge, do you have transportation home?:Yes,  family Do you have the ability to pay for your medications:Yes,  self pay  Release of information consent forms completed and in the chart;  Patient's signature needed at discharge.  Patient to Follow up at: Follow-up Information   Follow up with [facility] [place] On 06/18/2012. (Go to [facility] [place] on Monday morning 06/18/12, tell them you are there for [facility] )    Contact information:   [address] [place], [place] [address]  [facility] [phone]      Patient denies SI/HI:   Yes,  denies both    Safety Planning and Suicide Prevention discussed:  Yes,  in group setting; SPE not required as no SI/HI at admit or during stay  Clide Dales 06/15/2012, 4:30 PM

## 2012-06-15 NOTE — Progress Notes (Signed)
[facility] MD Progress Note  06/15/2012 5:18 PM Amanda [name]  MRN:  [id number] Subjective:  Amanda [name] is looking back at what she had and has lost due to her drinking. 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 She asked to be reinstated but she states they sent her back a thick manual with instructions, expectations. She got overwhelmed and gave up without even trying. 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 She admits to a great loss of identity once she let go of her license. Her husband is still supportive and she admits that her daughters get upset with him when she starts to drink and he allows her to stay at the house (enables?) She states she suffers from depression and anxiety irrespective of her drinking. She wants help with those symptoms. None of the medications tried in the past seemed to have helped. :Prozac, Zoloft, Paxil, Celexa, Lexapro, Wellbutrin, Abilify,  Diagnosis:   Axis I: Alcohol Dependence, Major Depression recurrent, GAD Axis II: Deferred Axis III:  Past Medical History  Diagnosis Date  . Hypertension   . Depression   . Anxiety   . Ear infection    Axis IV: other psychosocial or environmental problems Axis V: 51-60 moderate symptoms  ADL's:  Intact  Sleep: Fair  Appetite:  Fair  Suicidal Ideation:  Plan:  Denies Intent:  Denies Means:  Denies Homicidal Ideation:  Plan:  Denies Intent:  denies Means:  Denies AEB (as evidenced by):  Psychiatric Specialty Exam: Review of Systems  Constitutional: Negative.   HENT: Negative.   Eyes: Negative.   Respiratory: Negative.   Cardiovascular: Negative.   Gastrointestinal: Negative.   Genitourinary: Negative.   Musculoskeletal: Negative.   Skin: Negative.   Neurological: Negative.   Endo/Heme/Allergies: Negative.   Psychiatric/Behavioral: Positive for depression and substance abuse. The patient is nervous/anxious.      Blood pressure 114/70, pulse 63, temperature 98.2 F (36.8 C), temperature source Oral, resp. rate 18, height 5\' 2"  (1.575 m).There is no weight on file to calculate BMI.  General Appearance: Fairly Groomed  Patent attorney::  Fair  Speech:  Clear and Coherent  Volume:  Normal  Mood:  Anxious and Depressed  Affect:  worried, becomes tearful when talking about her losses  Thought Process:  Coherent and Goal Directed  Orientation:  Full (Time, Place, and Person)  Thought Content:  worries, concerns  Suicidal Thoughts:  No  Homicidal Thoughts:  No  Memory:  Immediate;   Fair Recent;   Fair Remote;   Fair  Judgement:  Fair  Insight:  Present  Psychomotor Activity:  Restlessness  Concentration:  Fair  Recall:  Fair  Akathisia:  No  Handed:  Right  AIMS (if indicated):     Assets:  Desire for Improvement  Sleep:  Number of Hours: 5.25   Current Medications: Current Facility-Administered Medications  Medication Dose Route Frequency Amanda [name] Last Rate Last Dose  . acetaminophen (TYLENOL) tablet 650 mg  650 mg Oral Q6H PRN Amanda Settle, MD   650 mg at 06/14/12 2133  . alum & mag hydroxide-simeth (MAALOX/MYLANTA) 200-200-20 MG/5ML suspension 30 mL  30 mL Oral Q4H PRN Amanda Settle, MD      . chlordiazePOXIDE (LIBRIUM) capsule 25 mg  25 mg Oral Q6H PRN Amanda Settle, MD   25 mg at 06/14/12 2133  . chlordiazePOXIDE (LIBRIUM) capsule 25 mg  25 mg Oral  BH-qamhs Amanda Fee, MD   25 mg at 06/15/12 0804   Followed by  . [START ON 06/16/2012] chlordiazePOXIDE (LIBRIUM) capsule 25 mg  25 mg Oral Daily Amanda Fee, MD      . DULoxetine (CYMBALTA) DR capsule 30 mg  30 mg Oral Daily Amanda Fee, MD   30 mg at 06/15/12 1535  . gabapentin (NEURONTIN) capsule 100 mg  100 mg Oral TID Amanda Fee, MD   100 mg at 06/15/12 1535  . hydrOXYzine (ATARAX/VISTARIL) tablet 25 mg  25 mg Oral Q6H PRN Amanda Settle, MD   25 mg at 06/14/12 2302  . loperamide  (IMODIUM) capsule 2-4 mg  2-4 mg Oral PRN Amanda Settle, MD      . magnesium hydroxide (MILK OF MAGNESIA) suspension 30 mL  30 mL Oral Daily PRN Amanda Settle, MD   30 mL at 06/13/12 1642  . multivitamin with minerals tablet 1 tablet  1 tablet Oral Daily Amanda Settle, MD   1 tablet at 06/15/12 0804  . ondansetron (ZOFRAN-ODT) disintegrating tablet 4 mg  4 mg Oral Q6H PRN Amanda Settle, MD      . thiamine (VITAMIN B-1) tablet 100 mg  100 mg Oral Daily Amanda Settle, MD   100 mg at 06/15/12 1610    Lab Results: No results found for this or any previous visit (from the past 48 hour(s)).  Physical Findings: AIMS: Facial and Oral Movements Muscles of Facial Expression: None, normal Lips and Perioral Area: None, normal Jaw: None, normal Tongue: None, normal,Extremity Movements Upper (arms, wrists, hands, fingers): None, normal Lower (legs, knees, ankles, toes): None, normal, Trunk Movements Neck, shoulders, hips: None, normal, Overall Severity Severity of abnormal movements (highest score from questions above): None, normal Incapacitation due to abnormal movements: None, normal Patient's awareness of abnormal movements (rate only patient's report): No Awareness, Dental Status Current problems with teeth and/or dentures?: No Does patient usually wear dentures?: No  CIWA:  CIWA-Ar Total: 0 COWS:  COWS Total Score: 5  Treatment Plan Summary: Daily contact with patient to assess and evaluate symptoms and progress in treatment Medication management  Plan: Supportive approach/coping skills/relapse prevention           Start Cymbalta 30 mg daily                     Neurontin 100 mg TID  Medical Decision Making Problem Points:  Review of last therapy session (1) and Review of psycho-social stressors (1) Data Points:  Review of new medications or change in dosage (2)  I certify that inpatient services furnished can reasonably be  expected to improve the patient's condition.   [name],Amanda A 06/15/2012, 5:18 PM

## 2012-06-15 NOTE — Progress Notes (Signed)
[facility] Group Notes:  (Nursing/MHT/Case Management/Adjunct)  Date:  06/15/2012  Time:  12:41 PM  Type of Therapy:  Psychoeducational Skills  Participation Level:  Active  Participation Quality:  Appropriate, Attentive, Sharing and Supportive  Affect:  Appropriate and Flat  Cognitive:  Alert and Appropriate  Insight:  Appropriate and Good  Engagement in Group:  Improving  Modes of Intervention:  Activity and Socialization  Summary of Progress/Problems: Pt was engaged in Therapeutic Activity where pts participated in a game called "Would you rather". Pt appeared to understand the concept of the group.   Dalia Heading 06/15/2012, 12:41 PM

## 2012-06-15 NOTE — Progress Notes (Signed)
Pt is resting in bed with eyes closed. RR WNL, even and unlabored. No distress noted. Level III in place for safety and pt remains safe. Lawrence Marseilles

## 2012-06-15 NOTE — Progress Notes (Signed)
[facility] Group Notes:  (Nursing/MHT/Case Management/Adjunct)  Date: 06/14/2012 Time:  2100  Type of Therapy:  wrap up group  Participation Level:  Active  Participation Quality:  Appropriate, Attentive and Sharing  Affect:  Depressed and Tearful  Cognitive:  Appropriate  Insight:  Lacking  Engagement in Group:  Engaged  Modes of Intervention:  Clarification, Education and Support  Summary of Progress/Problems: When pt was asked what was good about her day pt stated " I talked to my husband, it was good and bad. He said good and bad things.  Pt was asked to clarify what was good about her day while separating what her husband said was good and bad.  Pt was unable to do this. Pt reported her husband wanting to know "what is going to be different this time".  Pt reports that her husband wants her to cut contact with her 10 year old mother in a nursing home because she is her trigger to drink. Her mother is always telling her to leave her husband. Husband blames relapse on contact and stress with mother. Pt was unable to identify her trigger that lead to relapse but feels its not her mother and feels that husband and her make a good team. Pt urged to take care of family tensions that will lead to relapse and to identify her feelings and triggers on her own without other input.  Shelah Lewandowsky 06/15/2012, 1:27 AM

## 2012-06-15 NOTE — Progress Notes (Signed)
[facility] LCSW Aftercare Discharge Planning Group Note  06/15/2012 8:45 AM  Participation Quality:  Attentive and Sharing  Affect:  Appropriate  Cognitive:  Alert and Oriented  Insight:   Limited  Engagement in Group:   Limited  Modes of Intervention:  Clarification, Exploration, Rapport Building and Support  Summary of Progress/Problems:  Pt denies both suicidal and homicidal ideation.  On a scale of 1 to 10 with ten being the most ever experienced, the patient rates depression at a 4 and anxiety at a 6. States she has application for [facility] [employer] for CD IOP.  Patient confirms that she has had good intentions to go there before and has not followed up.  When asked about attending AA meetings Patient states she could go, but not on weekends as her husband wants her attention.    Clide Dales

## 2012-06-15 NOTE — Progress Notes (Signed)
[facility] LCSW Group Therapy  06/15/2012 1:15 PM  Type of Therapy:  Group Therapy  Participation Level:  Did Not Attend   Clide Dales 06/15/2012, 4:29 PM

## 2012-06-16 DIAGNOSIS — F329 Major depressive disorder, single episode, unspecified: Secondary | ICD-10-CM

## 2012-06-16 MED ORDER — GABAPENTIN 100 MG PO CAPS: 100.0000 mg | ORAL_CAPSULE | Freq: Three times a day (TID) | ORAL | Status: DC

## 2012-06-16 MED ORDER — DULOXETINE HCL 30 MG PO CPEP: 30.0000 mg | ORAL_CAPSULE | Freq: Every day | ORAL | Status: DC

## 2012-06-16 MED ORDER — ADULT MULTIVITAMIN W/MINERALS CH: 1.0000 | ORAL_TABLET | Freq: Every day | ORAL | Status: DC

## 2012-06-16 NOTE — Progress Notes (Signed)
Patient ID: Amanda [name], female   DOB: 02-Sep-1950, 62 y.o.   MRN: [id number]  D: Patient in the bed. Respirations even and non-labored A: Staff will monitor on q 15 minute checks, follow treatment plan, and give meds as ordered. R: Appears asleep

## 2012-06-16 NOTE — Discharge Summary (Addendum)
Physician Discharge Summary Note  Patient:  Amanda [name] is an 62 y.o., female MRN:  [id number] DOB:  Jul 19, 1950 Patient phone:  [phone] (home)  Patient address:   [address] [place] [address],   Date of Admission:  06/12/2012  Date of Discharge: 06/16/12  Reason for Admission:  Alcohol dependence  Discharge Diagnoses: Active Problems:   Alcohol dependence   Alcohol withdrawal   Major depression   Generalized anxiety disorder  Review of Systems  Constitutional: Negative.   HENT: Negative.   Eyes: Negative.   Respiratory: Negative.   Cardiovascular: Negative.   Gastrointestinal: Negative.   Genitourinary: Negative.   Musculoskeletal: Negative.   Skin: Negative.   Neurological: Negative.   Endo/Heme/Allergies: Negative.   Psychiatric/Behavioral: Positive for depression (Stabilized with medication prior to discharge) and substance abuse (Hx alcoholism). Negative for suicidal ideas, hallucinations and memory loss. The patient is nervous/anxious (Stabilized with medication prior to discharge). The patient does not have insomnia.    Axis Diagnosis:   AXIS I:  Generalized Anxiety Disorder and Alcohol dependence, Major depression AXIS II:  Deferred AXIS III:   Past Medical History  Diagnosis Date  . Hypertension   . Depression   . Anxiety   . Ear infection    AXIS IV:  economic problems, other psychosocial or environmental problems and Alcoholism AXIS V:  65  Level of Care:  OP  Hospital Course: Alcohol Dep  History of Present Illness:: Was here in October 2012. She was here for alcohol. She went home. She was able to abstain for nine months. She was going to AA, had a lot of support. She got too confident, she quit going to meetings. She relapsed. Since then she can go couple of months, then once or twice a week she binges. "Decieves herself" thinking she can drink one or two. Ends up drinking more. She went to [facility] around Christmas time. Was  able to stay abstinent for couple of months. Then relapsed again. She thinks she can just take the "edge off," but she cant quit. She admits to anxiety associated to loneliness, feeling not needed. She also experiences depression.  Upon admission in this hospital and after admission assessment/evaluation, it was determined that Amanda [name] was intoxicated and will need detoxification treatment to stabilize her system and combat the withdrawal symptoms of alcohol. Amanda [name] was started on Librium protocol for her alcohol detoxification. She was also enrolled in group counseling sessions and activities to learn coping skills that should help her after discharge to cope better and manage her alcoholism for a much longer sobriety. She also attended AA/NA meetings being offered and held in this unit. She does not have or present any other previous and or identifiable medical conditions that required treatment or monitoring. However, she was monitored closely for any potential problems that may arise as a reuslt of and or during detoxification treatment. Patient tolerated her treatment regimen and detoxification treatment without any significant adverse effects and or reactions presented.  Patient attended treatment team meeting this am and met with the team. Her symptoms, substance abuse issues, response to to treatment and discharge plans discussed. Patient endorsed that she is doing well and stable for discharge to pursue the next phase of her substance abuse treatment. She will continue psychiatric care on outpatient basis at the [facility] on 06/18/12 morning. The address, date and time for this appointment provided for patient.  She was encouraged to join/attend AA meetings being offered  and held within her community. To get a trusted sponsor from the advise of others or from whomever within the AA meetings seems to make sense, and has a proven track record, and will hold her responsible for her  sobriety, and both expects and insists on her total abstinence from alcohol.   Patient is currently being discharged to her home that she shares with his husband.  Upon discharge, patient adamantly denies suicidal, homicidal ideations, auditory, visual hallucinations, delusional thinking and or withdrawal symptoms. Patient left [facility] with all personal belongings in no apparent distress. He received 4 days worth samples of her discharge medications. Transportation per family.   Consults:  None  Significant Diagnostic Studies:  labs: CBC with diff, CMP, UDS, Toxicology tests  Discharge Vitals:   Blood pressure 125/72, pulse 60, temperature 97.3 F (36.3 C), temperature source Oral, resp. rate 18, height 5\' 2"  (1.575 m). There is no weight on file to calculate BMI. Lab Results:   No results found for this or any previous visit (from the past 72 hour(s)).  Physical Findings: AIMS: Facial and Oral Movements Muscles of Facial Expression: None, normal Lips and Perioral Area: None, normal Jaw: None, normal Tongue: None, normal,Extremity Movements Upper (arms, wrists, hands, fingers): None, normal Lower (legs, knees, ankles, toes): None, normal, Trunk Movements Neck, shoulders, hips: None, normal, Overall Severity Severity of abnormal movements (highest score from questions above): None, normal Incapacitation due to abnormal movements: None, normal Patient's awareness of abnormal movements (rate only patient's report): No Awareness, Dental Status Current problems with teeth and/or dentures?: No Does patient usually wear dentures?: No  CIWA:  CIWA-Ar Total: 1 COWS:  COWS Total Score: 5  Psychiatric Specialty Exam: See Psychiatric Specialty Exam and Suicide Risk Assessment completed by Attending Physician prior to discharge.  Discharge destination:  Home  Is patient on multiple antipsychotic therapies at discharge:  No   Has Patient had three or more failed trials of antipsychotic  monotherapy by history:  No  Recommended Plan for Multiple Antipsychotic Therapies: NA     Medication List    STOP taking these medications       aspirin EC 81 MG tablet     CALCIUM 600 + D PO     nebivolol 10 MG tablet  Commonly known as:  BYSTOLIC     omega-3 acid ethyl esters 1 G capsule  Commonly known as:  LOVAZA      TAKE these medications     Indication   DULoxetine 30 MG capsule  Commonly known as:  CYMBALTA  Take 1 capsule (30 mg total) by mouth daily. For depression   Indication:  Generalized Anxiety Disorder, Major Depressive Disorder, Musculoskeletal Pain     gabapentin 100 MG capsule  Commonly known as:  NEURONTIN  Take 1 capsule (100 mg total) by mouth 3 (three) times daily. For anxiety/pain control   Indication:  Alcohol Withdrawal Syndrome, Pain, Anxiety symptoms     multivitamin with minerals Tabs  Take 1 tablet by mouth daily. For vitamin supplement   Indication:  For vitamin deficiency           Follow-up Information   Follow up with [facility] On 06/18/2012. (Go to [facility] [place] on Monday morning 06/18/12, tell them you are there for [facility] )    Contact information:   [address], [place], [place] [address]  [facility] [phone]      Follow-up recommendations:   Activity:  As tolerated Diet: As recommended by your primary care doctor. Keep all scheduled follow-up appointments as recommended.  Comments:  Take all your medications as prescribed by your mental healthcare provider. Report any adverse effects and or reactions from your medicines to your outpatient provider promptly. Patient is instructed and cautioned to not engage in alcohol and or illegal drug use while on prescription medicines. In the event of worsening symptoms, patient is instructed to call the crisis hotline, 911 and or go to the nearest ED for appropriate evaluation and treatment of symptoms. Follow-up with your  primary care provider for your other medical issues, concerns and or health care needs.   Total Discharge Time:  Greater than 30 minutes.  Signed: Jacqulyn Cane, M.D.  06/16/2012 10:27 PM

## 2012-06-16 NOTE — Progress Notes (Signed)
Patient ID: Amanda [name], female   DOB: 1950-06-30, 62 y.o.   MRN: [id number] Writer reviewed pt discharge instructions with pt including medications, follow up care and crisis intervention. Pt acknowledged understanding of instructions and states that she has no reservations about leaving [facility] at this time. Pt denies SI/HI and AVH. Pt mood and affect are appropriate to the situation. Writer returned pt belongings from locker, and the pt is released into his own care.

## 2012-06-16 NOTE — Progress Notes (Signed)
D. Pt has been up and has been visible in milieu today, attending and participating in various milieu activities. Pt does not report any complaints of withdrawal today and stated that she feels ready for discharge in the morning. Pt has received medications without incident and did not verbalize any complaints of pain. A. Support and encouragement provided. R. Will continue to monitor.

## 2012-06-16 NOTE — Progress Notes (Signed)
The patient attended the A. A. Meeting this evening.  

## 2012-06-16 NOTE — Clinical Social Work Note (Signed)
[facility] Group Notes:  (Clinical Social Work)  06/16/2012     10-11AM  Summary of Progress/Problems:   The main focus of today's process group was for the patient to identify ways in which they have in the past sabotaged their own recovery. Motivational Interviewing was utilized to ask the group members what they get out of their substance use, and what they want to change.  The Stages of Change were explained, and members identified where they currently are with regard to stages of change.  The patient expressed that she is a binge drinker and deludes herself into thinking her husband, who does not drink at all, cannot tell.  She stated her husband is very religious, has been to several meetings with her (AA and [place]) and he did not like them.  She stated she is also a religious person, and asked that the group not judge her, was tearful with this request.  She feels she is between preparation and action stages, will go fully into action when leaves hospital.  Type of Therapy:  Group Therapy - Process   Participation Level:  Active  Participation Quality:  Appropriate, Attentive and Sharing  Affect:  Appropriate and Defensive  Cognitive:  Alert, Appropriate and Oriented  Insight:  Engaged  Engagement in Therapy:  Engaged  Modes of Intervention:  Education, Teacher, English as a foreign language, Exploration, Discussion, Motivational Interviewing   Ambrose Mantle, LCSW 06/16/2012, 12:04 PM

## 2012-06-16 NOTE — [facility] Suicide Risk Assessment (Signed)
Suicide Risk Assessment  Discharge Assessment      Patient reports she is doing well on on her current medications an denies any withdrawal symptoms or medication side effects.    Demographic Factors:  Caucasian and Unemployed  Mental Status Per Nursing Assessment::   On Admission:  NA  Current Mental Status by Physician: NA Psychiatric Specialty Exam:  General Appearance: Well-groomed   Eye Contact:: Good  Speech: Clear and Coherent   Volume: Normal  Mood: "Excited, motivated, positive."  Affect: Euthymic  Thought Process: Coherent and Goal Directed   Orientation: Full (Time, Place, and Person)   Thought Content: worries, concerns, shame and guilt   Suicidal Thoughts: No   Homicidal Thoughts: No   Memory: Immediate: Intact Recent: Intact Remote; Fair   Judgement: Fair   Insight: Present   Psychomotor Activity: Restlessness   Concentration: Fair   Recall: Fair   Akathisia: No   Handed: Right   AIMS (if indicated):   Assets: Desire for Improvement, Housing   Loss Factors: Decrease in vocational status and Financial problems/change in socioeconomic status  Historical Factors: Prior suicide attempts  Risk Reduction Factors:   Religious beliefs about death, Living with another person, especially a relative, Positive social support and Positive coping skills or problem solving skills  Continued Clinical Symptoms:  Alcohol/Substance Abuse/Dependencies-In recovery   Cognitive Features That Contribute To Risk:  Thought constriction (tunnel vision)    Suicide Risk:  Minimal: No identifiable suicidal ideation.  Patients presenting with no risk factors but with morbid ruminations; may be classified as minimal risk based on the severity of the depressive symptoms  Discharge Diagnoses:  AXIS I: Alcohol Dependence, Major Depression recurrent, Generalized Anxiety Disorder AXIS II:  No diagnosis AXIS III:   Past Medical History  Diagnosis Date  . Hypertension   .  Depression   . Anxiety   . Ear infection    AXIS IV:  other psychosocial or environmental problems AXIS V:  51-60 moderate symptoms  Plan Of Care/Follow-up recommendations:  Activity:  Increase as tolerated. Diet:  Recommend heart healthy diet Tests:  None Other:  Follow up with PCP for physical, has appointment in April 2014.  Maintain Sobriety, Advised AA 90 meeting in 90 days,  Is patient on multiple antipsychotic therapies at discharge:  No   Has Patient had three or more failed trials of antipsychotic monotherapy by history:  No  Recommended Plan for Multiple Antipsychotic Therapies: Not Applicable  Amanda [name] 06/16/2012, 10:02 AM

## 2012-06-16 NOTE — Progress Notes (Addendum)
Adult Psychoeducational Group Note  Date:  06/16/2012 Time:  10:05 AM  Group Topic/Focus:  Self-Inventory  Participation Level:  Active  Participation Quality:  Appropriate, Attentive and Sharing  Affect:  Anxious and Appropriate  Cognitive:  Alert and Appropriate  Insight: Appropriate and Good  Engagement in Group:  Engaged  Modes of Intervention:  Clarification, Rapport Building, Orientation, Explanation, Support  Additional Comments:  Pt was engaged and actively participated during group, pt was appropriate and expressed that she wanted to talk with a Child psychotherapist, informed pt that the 10 am group would be with a SW, encouraged pt to attend.   Alfonse Spruce 06/16/2012, 10:05 AM

## 2012-06-20 NOTE — Progress Notes (Signed)
Patient Discharge Instructions:  After Visit Summary (AVS):   Faxed to:  06/20/12 Discharge Summary Note:   Faxed to:  06/20/12 Psychiatric Admission Assessment Note:   Faxed to:  06/20/12 Suicide Risk Assessment - Discharge Assessment:   Faxed to:  06/20/12 Faxed/Sent to the Next Level Care provider:  06/20/12 Faxed to [facility] the [facility] @ [phone]  Jerelene Redden, 06/20/2012, 3:58 PM

## 2012-10-25 ENCOUNTER — Telehealth (HOSPITAL_COMMUNITY): Payer: Self-pay | Admitting: Family Medicine

## 2012-10-25 ENCOUNTER — Other Ambulatory Visit (HOSPITAL_COMMUNITY): Payer: Self-pay | Admitting: Family Medicine

## 2012-10-25 DIAGNOSIS — Z1231 Encounter for screening mammogram for malignant neoplasm of breast: Secondary | ICD-10-CM

## 2012-11-07 ENCOUNTER — Other Ambulatory Visit: Payer: Self-pay | Admitting: Obstetrics and Gynecology

## 2012-11-07 ENCOUNTER — Other Ambulatory Visit (HOSPITAL_COMMUNITY)
Admission: RE | Admit: 2012-11-07 | Discharge: 2012-11-07 | Disposition: A | Discharge status: Home / Self Care | Payer: BC Managed Care – PPO | Source: Ambulatory Visit | Attending: Obstetrics and Gynecology | Admitting: Obstetrics and Gynecology

## 2012-11-07 DIAGNOSIS — Z1151 Encounter for screening for human papillomavirus (HPV): Secondary | ICD-10-CM | POA: Insufficient documentation

## 2012-11-07 DIAGNOSIS — R87810 Cervical high risk human papillomavirus (HPV) DNA test positive: Secondary | ICD-10-CM | POA: Insufficient documentation

## 2012-11-07 DIAGNOSIS — Z01419 Encounter for gynecological examination (general) (routine) without abnormal findings: Secondary | ICD-10-CM | POA: Insufficient documentation

## 2013-01-19 ENCOUNTER — Emergency Department (HOSPITAL_COMMUNITY)
Admission: EM | Admit: 2013-01-19 | Discharge: 2013-01-20 | Disposition: A | Discharge status: Home / Self Care | Payer: BC Managed Care – PPO | Attending: Emergency Medicine | Admitting: Emergency Medicine

## 2013-01-19 ENCOUNTER — Encounter (HOSPITAL_COMMUNITY): Payer: Self-pay | Admitting: Emergency Medicine

## 2013-01-19 DIAGNOSIS — IMO0002 Reserved for concepts with insufficient information to code with codable children: Secondary | ICD-10-CM | POA: Insufficient documentation

## 2013-01-19 DIAGNOSIS — Z8669 Personal history of other diseases of the nervous system and sense organs: Secondary | ICD-10-CM | POA: Insufficient documentation

## 2013-01-19 DIAGNOSIS — Z87891 Personal history of nicotine dependence: Secondary | ICD-10-CM | POA: Insufficient documentation

## 2013-01-19 DIAGNOSIS — I10 Essential (primary) hypertension: Secondary | ICD-10-CM | POA: Insufficient documentation

## 2013-01-19 DIAGNOSIS — F329 Major depressive disorder, single episode, unspecified: Secondary | ICD-10-CM | POA: Insufficient documentation

## 2013-01-19 DIAGNOSIS — Z79899 Other long term (current) drug therapy: Secondary | ICD-10-CM | POA: Insufficient documentation

## 2013-01-19 DIAGNOSIS — F411 Generalized anxiety disorder: Secondary | ICD-10-CM | POA: Insufficient documentation

## 2013-01-19 DIAGNOSIS — K921 Melena: Secondary | ICD-10-CM | POA: Insufficient documentation

## 2013-01-19 DIAGNOSIS — F10929 Alcohol use, unspecified with intoxication, unspecified: Secondary | ICD-10-CM

## 2013-01-19 DIAGNOSIS — F911 Conduct disorder, childhood-onset type: Secondary | ICD-10-CM | POA: Insufficient documentation

## 2013-01-19 DIAGNOSIS — F3289 Other specified depressive episodes: Secondary | ICD-10-CM | POA: Insufficient documentation

## 2013-01-19 DIAGNOSIS — F1010 Alcohol abuse, uncomplicated: Secondary | ICD-10-CM

## 2013-01-19 LAB — COMPREHENSIVE METABOLIC PANEL
ALT: 17 U/L (ref 0–35)
AST: 25 U/L (ref 0–37)
Albumin: 4.1 g/dL (ref 3.5–5.2)
Alkaline Phosphatase: 68 U/L (ref 39–117)
BUN: 10 mg/dL (ref 6–23)
CO2: 25 mEq/L (ref 19–32)
Calcium: 9.1 mg/dL (ref 8.4–10.5)
Chloride: 101 mEq/L (ref 96–112)
Creatinine, Ser: 0.55 mg/dL (ref 0.50–1.10)
GFR calc Af Amer: >90 mL/min (ref >90)
GFR calc non Af Amer: >90 mL/min (ref >90)
Glucose, Bld: 108 mg/dL — ABNORMAL HIGH (ref 70–99)
Potassium: 3.8 mEq/L (ref 3.5–5.1)
Sodium: 140 mEq/L (ref 135–145)
Total Bilirubin: 0.2 mg/dL — ABNORMAL LOW (ref 0.3–1.2)
Total Protein: 8.2 g/dL (ref 6.0–8.3)

## 2013-01-19 LAB — CBC WITH DIFFERENTIAL/PLATELET
Basophils Absolute: 0 10*3/uL (ref 0.0–0.1)
Basophils Relative: 0 % (ref 0–1)
Eosinophils Absolute: 0.1 10*3/uL (ref 0.0–0.7)
Eosinophils Relative: 1 % (ref 0–5)
HCT: 42 % (ref 36.0–46.0)
Hemoglobin: 14.8 g/dL (ref 12.0–15.0)
Lymphocytes Relative: 38 % (ref 12–46)
Lymphs Abs: 3.3 10*3/uL (ref 0.7–4.0)
MCH: 30.9 pg (ref 26.0–34.0)
MCHC: 35.2 g/dL (ref 30.0–36.0)
MCV: 87.7 fL (ref 78.0–100.0)
Monocytes Absolute: 0.6 10*3/uL (ref 0.1–1.0)
Monocytes Relative: 7 % (ref 3–12)
Neutro Abs: 4.6 10*3/uL (ref 1.7–7.7)
Neutrophils Relative %: 54 % (ref 43–77)
Platelets: 331 10*3/uL (ref 150–400)
RBC: 4.79 MIL/uL (ref 3.87–5.11)
RDW: 13.4 % (ref 11.5–15.5)
WBC: 8.6 10*3/uL (ref 4.0–10.5)

## 2013-01-19 LAB — RAPID URINE DRUG SCREEN, HOSP PERFORMED
Amphetamines: NOT DETECTED
Barbiturates: NOT DETECTED
Benzodiazepines: NOT DETECTED
Cocaine: NOT DETECTED
Opiates: NOT DETECTED
Tetrahydrocannabinol: NOT DETECTED

## 2013-01-19 LAB — ETHANOL: Alcohol, Ethyl (B): 450 mg/dL (ref 0–11)

## 2013-01-19 LAB — GLUCOSE, CAPILLARY: Glucose-Capillary: 81 mg/dL (ref 70–99)

## 2013-01-19 MED ORDER — STERILE WATER FOR INJECTION IJ SOLN
INTRAMUSCULAR | Status: AC
  Administered 2013-01-19: 1.2 mL
  Filled 2013-01-19: qty 10

## 2013-01-19 MED ORDER — LORAZEPAM 1 MG PO TABS: 1.0000 mg | ORAL_TABLET | Freq: Four times a day (QID) | ORAL | Status: DC | PRN

## 2013-01-19 MED ORDER — LORAZEPAM 2 MG/ML IJ SOLN: 0.0000 mg | Freq: Two times a day (BID) | INTRAMUSCULAR | Status: DC

## 2013-01-19 MED ORDER — LORAZEPAM 2 MG/ML IJ SOLN: 0.0000 mg | Freq: Four times a day (QID) | INTRAMUSCULAR | Status: DC

## 2013-01-19 MED ORDER — LORAZEPAM 2 MG/ML IJ SOLN: 1.0000 mg | Freq: Four times a day (QID) | INTRAMUSCULAR | Status: DC | PRN

## 2013-01-19 MED ORDER — FOLIC ACID 1 MG PO TABS: 1.0000 mg | ORAL_TABLET | Freq: Every day | ORAL | Status: DC

## 2013-01-19 MED ORDER — ADULT MULTIVITAMIN W/MINERALS CH: 1.0000 | ORAL_TABLET | Freq: Every day | ORAL | Status: DC

## 2013-01-19 MED ORDER — LORAZEPAM 2 MG/ML IJ SOLN
INTRAMUSCULAR | Status: AC
  Administered 2013-01-19: 2 mg
  Filled 2013-01-19: qty 1

## 2013-01-19 MED ORDER — ZIPRASIDONE MESYLATE 20 MG IM SOLR
INTRAMUSCULAR | Status: AC
  Administered 2013-01-19: 10 mg via INTRAMUSCULAR
  Filled 2013-01-19: qty 20

## 2013-01-19 MED ORDER — VITAMIN B-1 100 MG PO TABS: 100.0000 mg | ORAL_TABLET | Freq: Every day | ORAL | Status: DC

## 2013-01-19 MED ORDER — SODIUM CHLORIDE 0.9 % IV BOLUS (SEPSIS)
1000.0000 mL | Freq: Once | INTRAVENOUS | Status: AC
  Administered 2013-01-19: 1000 mL via INTRAVENOUS

## 2013-01-19 MED ORDER — ZIPRASIDONE MESYLATE 20 MG IM SOLR: 20.0000 mg | Freq: Once | INTRAMUSCULAR | Status: DC

## 2013-01-19 MED ORDER — ZIPRASIDONE MESYLATE 20 MG IM SOLR
10.0000 mg | Freq: Once | INTRAMUSCULAR | Status: AC
  Administered 2013-01-19: 10 mg via INTRAMUSCULAR

## 2013-01-19 MED ORDER — THIAMINE HCL 100 MG/ML IJ SOLN: 100.0000 mg | Freq: Every day | INTRAMUSCULAR | Status: DC

## 2013-01-19 NOTE — ED Notes (Addendum)
EMS called by [facility], husband caught pt at [employer], pt intoxicated, EMS arrived pt lying in gravel drive sleeping, awaken by firefighter, no other symptoms other than ETOH. Unable to verify complete medical history due to ETOH

## 2013-01-19 NOTE — ED Notes (Signed)
Bed: ZO10 Expected date: 01/19/13 Expected time: 6:58 PM Means of arrival:  Comments: EMS 56 ETOH

## 2013-01-19 NOTE — ED Provider Notes (Signed)
Medical screening examination/treatment/procedure(s) were conducted as a shared visit with non-physician practitioner(s) and myself.  I personally evaluated the patient during the encounter  62 yo female presenting with AMS, presumed from acute alcohol intoxication.  Combative on arrival.  Received Geodon prior to my examination.  She was somnolent, but maintaining airway.  No signs of head trauma.  Labs show highly elevated EtOH.  Will allow to metabolize.  Clinical Impression: 1. Alcohol intoxication      Candyce Churn, MD 01/21/13 [phone]

## 2013-01-19 NOTE — ED Notes (Signed)
Ativan given and administered by Kerrie Pleasure, RN at bedside.  Pulled on override.  Pascal Lux Wingen, PAC, ordered 2 mg Ativan IV.

## 2013-01-19 NOTE — ED Notes (Addendum)
EDP to bedside to assess pt. Pt now calmer. Pt  Incontinent of urine. Linens and gown changed. Pt resting, will continue to monitor

## 2013-01-19 NOTE — ED Notes (Signed)
PA at bedside.

## 2013-01-19 NOTE — ED Provider Notes (Signed)
CSN: [id number]     Arrival date & time 01/19/13  1902 History   First MD Initiated Contact with Patient 01/19/13 1910     Chief Complaint  Patient presents with  . Alcohol Intoxication   (Consider location/radiation/quality/duration/timing/severity/associated sxs/prior Treatment) HPI Comments: Patient brought in today by EMS due to alcohol intoxication.  EMS called by [facility], husband caught pt at boyfriend's house.  EMS arrived and patient was lying in gravel drive sleeping.  Patient unable to provide history due to intoxicated state.  Review of the chart shows that the patient has a history of Alcoholism and has been in the ED previously due to Alcohol intoxication.    The history is provided by the patient.    Past Medical History  Diagnosis Date  . Hypertension   . Depression   . Anxiety   . Ear infection    Past Surgical History  Procedure Laterality Date  . Colonscopy  11/2002  . Upper gastrointestinal endoscopy  11/2002  . Hemorrhoid surgery  08/2004  . Dilation and curettage of uterus  09/2007    hysteroscopy for fibroid  . Breast surgery    . Laparoscopic assisted vaginal hysterectomy  12/07/2011    Procedure: LAPAROSCOPIC ASSISTED VAGINAL HYSTERECTOMY;  Surgeon: Geryl Rankins, MD;  Location: [facility];  Service: Gynecology;  Laterality: N/A;  . Abdominal hysterectomy     No family history on file. History  Substance Use Topics  . Smoking status: Former Games developer  . Smokeless tobacco: Not on file  . Alcohol Use: Yes     Comment: pt here with strong etoh odor   OB History   Grav Para Term Preterm Abortions TAB SAB Ect Mult Living                 Review of Systems  Unable to perform ROS: Other    Allergies  Codeine and Medrol  Home Medications   Current Outpatient Rx  Name  Route  Sig  Dispense  Refill  . DULoxetine (CYMBALTA) 30 MG capsule   Oral   Take 1 capsule (30 mg total) by mouth daily. For depression   30 capsule   0   . gabapentin (NEURONTIN) 100 MG  capsule   Oral   Take 1 capsule (100 mg total) by mouth 3 (three) times daily. For anxiety/pain control   90 capsule   0   . Multiple Vitamin (MULTIVITAMIN WITH MINERALS) TABS   Oral   Take 1 tablet by mouth daily. For vitamin supplement          BP 119/66  Pulse 92  Temp(Src) 98.4 F (36.9 C) (Oral)  Resp 22  SpO2 100% Physical Exam  Nursing note and vitals reviewed. Constitutional: She appears well-developed and well-nourished.  HENT:  Head: Normocephalic and atraumatic.  Mouth/Throat: Oropharynx is clear and moist.  Cardiovascular: Normal rate, regular rhythm and normal heart sounds.   Pulmonary/Chest: Effort normal and breath sounds normal.  Neurological: She is alert.  Skin: Skin is warm and dry.  Psychiatric: She has a normal mood and affect. She is agitated and aggressive. She is not actively hallucinating.  Patient combative with staff and trying to spit on the staff.  Patient yelling and cursing.    ED Course  Procedures (including critical care time) Labs Review Labs Reviewed  CBC WITH DIFFERENTIAL  COMPREHENSIVE METABOLIC PANEL  ETHANOL   Imaging Review No results found.  EKG Interpretation   None     7:20 PM Evaluated the patient  initially.  Patient very combative attempting to hit staff.  Patient is also attempting to spit on the staff.  Patient yelling loudly.  Ativan 2 mg ordered.   7:45 PM Patient continues to be combative and is yelling.  Patient attempting to hit staff.  Will order Geodon 10 mg IM and reassess.  12:06 AM Reassessed patient.  Patient currently sleeping.  Pulse ox 100 on RA.  VSS.  1:00 AM Patient signed out to [employer], PA-C at shift change.  Patient is to stay in the ED until clinically sober.  MDM  No diagnosis found. Patient brought in today due to alcohol intoxication.  Labs unremarkable aside from alcohol level of 450.  Patient initially very combative with staff.  Patient given Ativan and Geodon.  Patient given  IVF and put on CIWA protocol.  Patient allowed to sober up in the ED.  Patient signed out to [facility], PA-C at shift change.    Pascal Lux [place], PA-C 01/21/13 [phone]

## 2013-01-19 NOTE — ED Notes (Addendum)
On arrival to room, pt combative, uncooperative with staff, and spitting. [employer] and security at bedside at this time. Pt changed out of personal clothing, security wanded.

## 2013-01-20 LAB — OCCULT BLOOD, POC DEVICE: Fecal Occult Bld: POSITIVE — AB

## 2013-01-20 LAB — ETHANOL: Alcohol, Ethyl (B): 178 mg/dL — ABNORMAL HIGH (ref 0–11)

## 2013-01-20 MED ORDER — ONDANSETRON 4 MG PO TBDP
4.0000 mg | ORAL_TABLET | Freq: Once | ORAL | Status: AC
  Administered 2013-01-20: 4 mg via ORAL
  Filled 2013-01-20: qty 1

## 2013-01-20 MED ORDER — LORAZEPAM 2 MG/ML IJ SOLN
0.5000 mg | Freq: Once | INTRAMUSCULAR | Status: AC
  Administered 2013-01-20: 0.5 mg via INTRAVENOUS
  Filled 2013-01-20: qty 1

## 2013-01-20 NOTE — ED Provider Notes (Signed)
Patient care assumed from [facility], PA-C at shift change. Patient presented intoxicated. Ethanol on arrival 450. Plan to watch patient and recheck ethanol in AM.  Patient has been hemodynamically stable throughout ED course. Ethanol rechecked and down to 178. Will allow patient to continue to sober with plan to discharge when clinically sober.  Patient signed out to Coral Ceo, PA-C at shift change for further evaluation and dispo as appropriate.   Filed Vitals:   01/19/13 2311 01/20/13 0030 01/20/13 0130 01/20/13 0400  BP: 106/71 106/65 121/67 111/59  Pulse: 99 82 87 81  Temp:      TempSrc:      Resp: 20   22  SpO2: 100% 100% 100% 95%     Antony Madura, PA-C 01/20/13 [phone]

## 2013-01-20 NOTE — Progress Notes (Signed)
CSW saw pt, per MD and RN request, because pt was medically/psychiatrically stable for d/c. However, pt did not have a way to get home. Per discussion with MD and RN, pt medically stable and able to ambulate, but is quite shaky on her feet, and likely unable to be able to walk the three miles home from the bus stop.  CSW met with pt. Pt used CSW's phone to call husband--he did pick up and she left a message. Pt gave permission for CSW to try to contact later--CSW tried to call 3 more times over course of 1.5 hours, but got no answer. Per RN, as well as pt report, pt has been trying to call him all morning and has been unable to reach.  CSW and pt brainstormed other supports to call. Pt does not have her pocketbook, wallet, or cell phone and does not have other numbers memorized. Pt provided neighbors, Chip Boer and Ellin Goodie, and their address. CSW looked up this couple in phonebook, but phone number was disconnected. CSW asked if pt would consider going to a shelter for the evening, or consider detox treatment. Pt declined these services and stated she would rather be discharged with a bus pass.  CSW consulted with Chiropodist of CSW to receive approval for a cab voucher. Because no other supports can be reached, and pt is medically cleared, pt can use cab voucher. CSW to facilitate this with nurse.  CSW inquired as to whether it was safe for pt to return home with her husband. Pt stated she feels safe returning home. Pt states a key is hidden so if her husband isn't home she can still get back inside.  EDP and RN aware of d/c plan.  York Spaniel [place], [phone]     ED CSW
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
## 2013-01-20 NOTE — ED Notes (Signed)
Pt still nauseous after Zofran, shaking. Giving ativan per PA to help relieve s/sx

## 2013-01-20 NOTE — ED Notes (Signed)
SW at bedside.

## 2013-01-20 NOTE — ED Notes (Signed)
Per PA, holding Ativan to see if pt is appropriate for d/c. Pt states that she does not drink everyday, just hungover

## 2013-01-20 NOTE — ED Notes (Signed)
Pt reports that she is having nausea and "blood in stool". Pt states no hx, but reports a hx of hemorrhoids. Notified PA

## 2013-01-20 NOTE — ED Notes (Signed)
PA requested social work consult to see if arrangements can be made to get pt home. Pt states bus pass will not get her close enough and she will have to walk too far, which she states she is not able.

## 2013-01-21 NOTE — ED Notes (Signed)
Charge found 1 pt belonging bag, attempted to call pt, left message asking pt to call back.

## 2013-01-21 NOTE — ED Provider Notes (Signed)
Medical screening examination/treatment/procedure(s) were conducted as a shared visit with non-physician practitioner(s) and myself.  I personally evaluated the patient during the encounter.   Please see my separate note.     Candyce Churn, MD 01/21/13 [phone]

## 2013-01-21 NOTE — ED Provider Notes (Signed)
Medical screening examination/treatment/procedure(s) were performed by non-physician practitioner and as supervising physician I was immediately available for consultation/collaboration.   Jurnie Garritano E Maria Coin, MD 01/21/13 0922 

## 2013-01-21 NOTE — ED Provider Notes (Signed)
Medical screening examination/treatment/procedure(s) were performed by non-physician practitioner and as supervising physician I was immediately available for consultation/collaboration.   Loren Racer, MD 01/21/13 [phone]

## 2013-06-26 ENCOUNTER — Other Ambulatory Visit: Payer: Self-pay | Admitting: Internal Medicine

## 2013-06-26 DIAGNOSIS — Z1231 Encounter for screening mammogram for malignant neoplasm of breast: Secondary | ICD-10-CM

## 2013-07-11 ENCOUNTER — Ambulatory Visit
Admission: RE | Admit: 2013-07-11 | Discharge: 2013-07-11 | Disposition: A | Discharge status: Home / Self Care | Payer: BC Managed Care – PPO | Source: Ambulatory Visit | Attending: Internal Medicine | Admitting: Internal Medicine

## 2013-07-11 DIAGNOSIS — Z1231 Encounter for screening mammogram for malignant neoplasm of breast: Secondary | ICD-10-CM

## 2013-07-12 ENCOUNTER — Other Ambulatory Visit (HOSPITAL_COMMUNITY)
Admission: RE | Admit: 2013-07-12 | Discharge: 2013-07-12 | Disposition: A | Discharge status: Home / Self Care | Payer: BC Managed Care – PPO | Source: Ambulatory Visit | Attending: Obstetrics and Gynecology | Admitting: Obstetrics and Gynecology

## 2013-07-12 ENCOUNTER — Other Ambulatory Visit: Payer: Self-pay | Admitting: Obstetrics and Gynecology

## 2013-07-12 DIAGNOSIS — Z124 Encounter for screening for malignant neoplasm of cervix: Secondary | ICD-10-CM | POA: Insufficient documentation

## 2013-07-12 DIAGNOSIS — R87810 Cervical high risk human papillomavirus (HPV) DNA test positive: Secondary | ICD-10-CM | POA: Insufficient documentation

## 2013-07-12 DIAGNOSIS — Z1151 Encounter for screening for human papillomavirus (HPV): Secondary | ICD-10-CM | POA: Insufficient documentation

## 2013-07-31 ENCOUNTER — Ambulatory Visit: Payer: Self-pay | Admitting: Internal Medicine

## 2015-02-02 ENCOUNTER — Other Ambulatory Visit: Payer: Self-pay

## 2015-02-02 DIAGNOSIS — Z1231 Encounter for screening mammogram for malignant neoplasm of breast: Secondary | ICD-10-CM

## 2015-02-04 ENCOUNTER — Ambulatory Visit: Payer: Self-pay

## 2015-04-28 ENCOUNTER — Ambulatory Visit
Admission: RE | Admit: 2015-04-28 | Discharge: 2015-04-28 | Disposition: A | Discharge status: Home / Self Care | Payer: BLUE CROSS/BLUE SHIELD | Source: Ambulatory Visit

## 2015-04-28 DIAGNOSIS — Z1231 Encounter for screening mammogram for malignant neoplasm of breast: Secondary | ICD-10-CM

## 2016-09-05 ENCOUNTER — Other Ambulatory Visit: Payer: Self-pay | Admitting: Internal Medicine

## 2016-09-05 DIAGNOSIS — Z1231 Encounter for screening mammogram for malignant neoplasm of breast: Secondary | ICD-10-CM

## 2016-09-19 ENCOUNTER — Ambulatory Visit: Payer: Self-pay

## 2016-11-21 ENCOUNTER — Ambulatory Visit
Admission: RE | Admit: 2016-11-21 | Discharge: 2016-11-21 | Disposition: A | Discharge status: Home / Self Care | Payer: BLUE CROSS/BLUE SHIELD | Source: Ambulatory Visit | Attending: Internal Medicine | Admitting: Internal Medicine

## 2016-11-21 DIAGNOSIS — Z1231 Encounter for screening mammogram for malignant neoplasm of breast: Secondary | ICD-10-CM

## 2016-11-23 ENCOUNTER — Other Ambulatory Visit: Payer: Self-pay | Admitting: Internal Medicine

## 2016-11-23 DIAGNOSIS — R928 Other abnormal and inconclusive findings on diagnostic imaging of breast: Secondary | ICD-10-CM

## 2016-11-28 ENCOUNTER — Ambulatory Visit
Admission: RE | Admit: 2016-11-28 | Discharge: 2016-11-28 | Disposition: A | Discharge status: Home / Self Care | Payer: BLUE CROSS/BLUE SHIELD | Source: Ambulatory Visit | Attending: Internal Medicine | Admitting: Internal Medicine

## 2016-11-28 ENCOUNTER — Other Ambulatory Visit: Payer: Self-pay | Admitting: Internal Medicine

## 2016-11-28 DIAGNOSIS — R921 Mammographic calcification found on diagnostic imaging of breast: Secondary | ICD-10-CM

## 2016-11-28 DIAGNOSIS — R928 Other abnormal and inconclusive findings on diagnostic imaging of breast: Secondary | ICD-10-CM

## 2016-11-28 DIAGNOSIS — N6489 Other specified disorders of breast: Secondary | ICD-10-CM

## 2016-12-01 ENCOUNTER — Other Ambulatory Visit: Payer: Self-pay | Admitting: Internal Medicine

## 2016-12-01 ENCOUNTER — Ambulatory Visit
Admission: RE | Admit: 2016-12-01 | Discharge: 2016-12-01 | Disposition: A | Discharge status: Home / Self Care | Payer: BLUE CROSS/BLUE SHIELD | Source: Ambulatory Visit | Attending: Internal Medicine | Admitting: Internal Medicine

## 2016-12-01 DIAGNOSIS — N6489 Other specified disorders of breast: Secondary | ICD-10-CM

## 2016-12-01 DIAGNOSIS — R921 Mammographic calcification found on diagnostic imaging of breast: Secondary | ICD-10-CM

## 2016-12-12 ENCOUNTER — Telehealth: Payer: Self-pay | Admitting: Cardiovascular Disease

## 2016-12-12 NOTE — Telephone Encounter (Signed)
[facility] 12/14/16 with Dr Duke Salviaandolph.  Records put with Dr Leonides Sakeandolph's schedule for 12/14/16. lp

## 2016-12-14 ENCOUNTER — Encounter: Payer: Self-pay | Admitting: Cardiovascular Disease

## 2016-12-14 ENCOUNTER — Ambulatory Visit (INDEPENDENT_AMBULATORY_CARE_PROVIDER_SITE_OTHER): Payer: BLUE CROSS/BLUE SHIELD | Admitting: Cardiovascular Disease

## 2016-12-14 VITALS — BP 133/73 | HR 55 | Ht 62.0 in | Wt 129.0 lb

## 2016-12-14 DIAGNOSIS — R079 Chest pain, unspecified: Secondary | ICD-10-CM

## 2016-12-14 DIAGNOSIS — R42 Dizziness and giddiness: Secondary | ICD-10-CM | POA: Diagnosis not present

## 2016-12-14 DIAGNOSIS — R001 Bradycardia, unspecified: Secondary | ICD-10-CM

## 2016-12-14 DIAGNOSIS — E7800 Pure hypercholesterolemia, unspecified: Secondary | ICD-10-CM

## 2016-12-14 DIAGNOSIS — I208 Other forms of angina pectoris: Secondary | ICD-10-CM

## 2016-12-14 NOTE — Progress Notes (Signed)
Cardiology Office Note   Date:  12/18/2016   ID:  Amanda [name], DOB 1951-03-18, MRN [id number]  PCP:  Georgianne Fick, MD  Cardiologist:   Chilton Si, MD   No chief complaint on file.     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  Amanda [name] report episodes of L sided chest and arm heaviness.  When this ocurs she feels like she can't get a deep breath.  It typically occurs with exertion and there is no associated nausea or diaphoresis. The episodes have been ongoing for several months and occur at least 2 times per week.  She has been very busy helping to take care of her twin toddler grandchildren.  She notes the chest discomfort when working outside in her yard or when chasing her grandchildren. She does not get much formal exercise but is very active. She denies lower extremity edema, orthopnea, or PND. She also has not noted any palpitations. She does get lightheaded especially when bending over. In the past she has not tolerated statins. She developed joint pain on atorvastatin.  She notes that her heart rate has always been in the 60s to 70s but believe the it is sometimes lower.  She is unsure of his lower heart rates coincide with her episodes of dizziness. She was recently diagnosed with a right breast mass and is awaiting surgery to have this removed.   Past Medical History:  Diagnosis Date  . Anxiety   . Bradycardia 12/18/2016  . Chest pain 12/18/2016  . Depression   . Ear infection   . Hyperlipidemia 12/18/2016  . Hypertension   . Lightheadedness 12/18/2016    Past Surgical History:  Procedure Laterality Date  . ABDOMINAL HYSTERECTOMY    . AUGMENTATION MAMMAPLASTY Bilateral   . BREAST SURGERY    . colonscopy  11/2002  . DILATION AND CURETTAGE OF UTERUS  09/2007   hysteroscopy for fibroid  . HEMORRHOID SURGERY  08/2004    . LAPAROSCOPIC ASSISTED VAGINAL HYSTERECTOMY  12/07/2011   Procedure: LAPAROSCOPIC ASSISTED VAGINAL HYSTERECTOMY;  Surgeon: Geryl Rankins, MD;  Location: [facility];  Service: Gynecology;  Laterality: N/A;  . UPPER GASTROINTESTINAL ENDOSCOPY  11/2002     Current Outpatient Prescriptions  Medication Sig Dispense Refill  . Calcium Carb-Cholecalciferol (CALCIUM 1000 + D PO) Take 1 tablet by mouth daily.    Marland Kitchen glucosamine-chondroitin 500-400 MG tablet Take 1 tablet by mouth daily.    . Red Yeast Rice Extract (RED YEAST RICE PO) Take 1 tablet by mouth daily.    . Multiple Vitamin (MULTIVITAMIN WITH MINERALS) TABS tablet Take 1 tablet by mouth daily.     No current facility-administered medications for this visit.     Allergies:   Codeine and Medrol [methylprednisolone]    Social History:  The patient  reports that she has quit smoking. She has never used smokeless tobacco. She reports that she drinks alcohol. She reports that she does not use drugs.   Family History:  The patient's family history includes CAD in her father; CVA in her maternal grandfather and maternal grandmother; Crohn's disease in her mother; Dementia in her mother; Hyperlipidemia in her brother, daughter, father, mother, and sister; Hypertension in her brother and sister; Stomach cancer in her paternal grandmother.    ROS:  Please see the history of present illness.   Otherwise, review of systems are  positive for none.   All other systems are reviewed and negative.    PHYSICAL EXAM: VS:  BP 133/73   Pulse (!) 55   Ht 5\' 2"  (1.575 m)   Wt 58.5 kg (129 lb)   SpO2 98%   BMI 23.59 kg/m  , BMI Body mass index is 23.59 kg/m. GENERAL:  Well appearing HEENT:  Pupils equal round and reactive, fundi not visualized, oral mucosa unremarkable NECK:  No jugular venous distention, waveform within normal limits, carotid upstroke brisk and symmetric, no bruits, no thyromegaly LYMPHATICS:  No cervical adenopathy LUNGS:  Clear to  auscultation bilaterally HEART: Bradycardic.  Regular rhythm.  PMI not displaced or sustained,S1 and S2 within normal limits, no S3, no S4, no clicks, no rubs, no murmurs ABD:  Flat, positive bowel sounds normal in frequency in pitch, no bruits, no rebound, no guarding, no midline pulsatile mass, no hepatomegaly, no splenomegaly EXT:  2 plus pulses throughout, no edema, no cyanosis no clubbing SKIN:  No rashes no nodules NEURO:  Cranial nerves II through XII grossly intact, motor grossly intact throughout PSYCH:  Cognitively intact, oriented to person place and time   EKG:  EKG is ordered today. The ekg ordered today demonstrates sinus bradycardia.  Rate 43 bpm.     Recent Labs: No results found for requested labs within last 8760 hours.    Lipid Panel No results found for: CHOL, TRIG, HDL, CHOLHDL, VLDL, LDLCALC, LDLDIRECT    Wt Readings from Last 3 Encounters:  12/16/16 58.5 kg (129 lb)  12/14/16 58.5 kg (129 lb)  01/29/12 60.8 kg (134 lb)      ASSESSMENT AND PLAN:  # Chest pain: Symptoms are concerning for ischemia.  We will get an exercise Myoview to better evaluate. This will also help to assess for chronotropic incompetence.   # Lightheadedness: # Bradycardia:  It seems that this is mostly orthostatic. However her heart rate has been down into the 40s recently. We will get a 48 hour Holter.  # Hyperlipidemia: Amanda [name] hasn't tolerated atorvastatin and is currently taking red yeast rice.  We will get a copy of her most recent lipids prior to determining her need for a statin.  Current medicines are reviewed at length with the patient today.  The patient does not have concerns regarding medicines.  The following changes have been made:  no change  Labs/ tests ordered today include:   Orders Placed This Encounter  Procedures  . Myocardial Perfusion Imaging  . Cardiac event monitor     Disposition:   FU with Deniya Craigo C. Duke Salviaandolph, MD, [facility] in  2 weeks   This  note was written with the assistance of speech recognition software.  Please excuse any transcriptional errors.  Signed, Adaja Wander C. Duke Salviaandolph, MD, [facility]  12/18/2016 9:41 AM    [facility]

## 2016-12-14 NOTE — Patient Instructions (Addendum)
Medication Instructions:  Your physician recommends that you continue on your current medications as directed. Please refer to the Current Medication list given to you today.  Labwork: NONE  Testing/Procedures: Your physician has recommended that you wear an event monitor. Event monitors are medical devices that record the heart's electrical activity. Doctors most often us these monitors to diagnose arrhythmias. Arrhythmias are problems with the speed or rhythm of the heartbeat. The monitor is a small, portable device. You can wear one while you do your normal daily activities. This is usually used to diagnose what is causing palpitations/syncope (passing out). [facility] AT [address] 7 DAY EVENT   Your physician has requested that you have en exercise stress myoview. For further information please visit [email or web address]. Please follow instruction sheet, as given.  Follow-Up: Your physician recommends that you schedule a follow-up appointment in: 2-3 WEEKS (AFTER TEST RESULTS ARE BACK) [facility]  If you need a refill on your cardiac medications before your next appointment, please call your pharmacy.

## 2016-12-15 ENCOUNTER — Ambulatory Visit (INDEPENDENT_AMBULATORY_CARE_PROVIDER_SITE_OTHER): Payer: BLUE CROSS/BLUE SHIELD

## 2016-12-15 ENCOUNTER — Telehealth (HOSPITAL_COMMUNITY): Payer: Self-pay

## 2016-12-15 DIAGNOSIS — R001 Bradycardia, unspecified: Secondary | ICD-10-CM | POA: Diagnosis not present

## 2016-12-15 DIAGNOSIS — R42 Dizziness and giddiness: Secondary | ICD-10-CM | POA: Diagnosis not present

## 2016-12-15 DIAGNOSIS — R079 Chest pain, unspecified: Secondary | ICD-10-CM

## 2016-12-15 NOTE — Telephone Encounter (Signed)
Encounter complete. 

## 2016-12-15 NOTE — Telephone Encounter (Signed)
Pt given option to change scheduled time due to cancellations. Pt declined and wants to stay at original time.

## 2016-12-16 ENCOUNTER — Ambulatory Visit (HOSPITAL_COMMUNITY)
Admission: RE | Admit: 2016-12-16 | Discharge: 2016-12-16 | Disposition: A | Discharge status: Home / Self Care | Payer: BLUE CROSS/BLUE SHIELD | Source: Ambulatory Visit | Attending: Cardiology | Admitting: Cardiology

## 2016-12-16 DIAGNOSIS — I2510 Atherosclerotic heart disease of native coronary artery without angina pectoris: Secondary | ICD-10-CM | POA: Insufficient documentation

## 2016-12-16 DIAGNOSIS — E785 Hyperlipidemia, unspecified: Secondary | ICD-10-CM | POA: Insufficient documentation

## 2016-12-16 DIAGNOSIS — F329 Major depressive disorder, single episode, unspecified: Secondary | ICD-10-CM | POA: Insufficient documentation

## 2016-12-16 DIAGNOSIS — R9431 Abnormal electrocardiogram [ECG] [EKG]: Secondary | ICD-10-CM | POA: Diagnosis not present

## 2016-12-16 DIAGNOSIS — Z87891 Personal history of nicotine dependence: Secondary | ICD-10-CM | POA: Insufficient documentation

## 2016-12-16 DIAGNOSIS — R079 Chest pain, unspecified: Secondary | ICD-10-CM | POA: Insufficient documentation

## 2016-12-16 DIAGNOSIS — F419 Anxiety disorder, unspecified: Secondary | ICD-10-CM | POA: Insufficient documentation

## 2016-12-16 DIAGNOSIS — R42 Dizziness and giddiness: Secondary | ICD-10-CM | POA: Insufficient documentation

## 2016-12-16 DIAGNOSIS — R001 Bradycardia, unspecified: Secondary | ICD-10-CM | POA: Insufficient documentation

## 2016-12-16 LAB — MYOCARDIAL PERFUSION IMAGING
LV dias vol: 79 mL (ref 46–106)
LV sys vol: 34 mL
Peak HR: 130 {beats}/min
Rest HR: 48 {beats}/min
SDS: 0
SRS: 3
SSS: 3
TID: 1.16

## 2016-12-16 MED ORDER — TECHNETIUM TC 99M TETROFOSMIN IV KIT
29.0000 | PACK | Freq: Once | INTRAVENOUS | Status: AC | PRN
  Administered 2016-12-16: 29 via INTRAVENOUS
  Filled 2016-12-16: qty 29

## 2016-12-16 MED ORDER — REGADENOSON 0.4 MG/5ML IV SOLN
0.4000 mg | Freq: Once | INTRAVENOUS | Status: AC
  Administered 2016-12-16: 0.4 mg via INTRAVENOUS

## 2016-12-16 MED ORDER — TECHNETIUM TC 99M TETROFOSMIN IV KIT
10.2000 | PACK | Freq: Once | INTRAVENOUS | Status: AC | PRN
  Administered 2016-12-16: 10.2 via INTRAVENOUS
  Filled 2016-12-16: qty 11

## 2016-12-18 ENCOUNTER — Encounter: Payer: Self-pay | Admitting: Cardiovascular Disease

## 2016-12-18 DIAGNOSIS — R079 Chest pain, unspecified: Secondary | ICD-10-CM

## 2016-12-18 DIAGNOSIS — R42 Dizziness and giddiness: Secondary | ICD-10-CM

## 2016-12-18 DIAGNOSIS — E785 Hyperlipidemia, unspecified: Secondary | ICD-10-CM

## 2016-12-18 DIAGNOSIS — R001 Bradycardia, unspecified: Secondary | ICD-10-CM

## 2016-12-18 HISTORY — DX: Hyperlipidemia, unspecified: E78.5

## 2016-12-18 HISTORY — DX: Dizziness and giddiness: R42

## 2016-12-18 HISTORY — DX: Chest pain, unspecified: R07.9

## 2016-12-18 HISTORY — DX: Bradycardia, unspecified: R00.1

## 2016-12-27 ENCOUNTER — Other Ambulatory Visit: Payer: Self-pay | Admitting: General Surgery

## 2016-12-27 DIAGNOSIS — R928 Other abnormal and inconclusive findings on diagnostic imaging of breast: Secondary | ICD-10-CM

## 2016-12-28 ENCOUNTER — Other Ambulatory Visit: Payer: Self-pay | Admitting: General Surgery

## 2016-12-29 ENCOUNTER — Other Ambulatory Visit: Payer: Self-pay | Admitting: General Surgery

## 2016-12-29 DIAGNOSIS — N6310 Unspecified lump in the right breast, unspecified quadrant: Secondary | ICD-10-CM

## 2016-12-30 ENCOUNTER — Ambulatory Visit (INDEPENDENT_AMBULATORY_CARE_PROVIDER_SITE_OTHER): Payer: BLUE CROSS/BLUE SHIELD | Admitting: Podiatry

## 2016-12-30 ENCOUNTER — Encounter: Payer: Self-pay | Admitting: Podiatry

## 2016-12-30 DIAGNOSIS — B353 Tinea pedis: Secondary | ICD-10-CM

## 2016-12-30 DIAGNOSIS — M79671 Pain in right foot: Secondary | ICD-10-CM | POA: Diagnosis not present

## 2016-12-30 DIAGNOSIS — L600 Ingrowing nail: Secondary | ICD-10-CM | POA: Diagnosis not present

## 2016-12-30 DIAGNOSIS — M10071 Idiopathic gout, right ankle and foot: Secondary | ICD-10-CM | POA: Diagnosis not present

## 2016-12-30 DIAGNOSIS — B351 Tinea unguium: Secondary | ICD-10-CM | POA: Diagnosis not present

## 2016-12-30 NOTE — Progress Notes (Signed)
SUBJECTIVE: 66 y.o. year old female presents stating itch fungal nail with hard to manage on right great toe. Also having pain at the first MPJ on right from Gout problem. Scheduled for right breast surgery coming month.  REVIEW OF SYSTEMS: Pertinent items noted in HPI and remainder of comprehensive ROS otherwise negative.  OBJECTIVE: DERMATOLOGIC EXAMINATION: Nails: Thick discolored and ingrown right hallux cal nail. Fungal nails 1-5 right, and left great toe. Dry peeling skin in between the first and 2nd toe right.  VASCULAR EXAMINATION OF LOWER LIMBS: All pedal pulses are not palpable on both feet. Capillary Filling times within 3 seconds in all digits.   NEUROLOGIC EXAMINATION OF THE LOWER LIMBS: All epicritic and tactile sensations grossly intact. Sharp and Dull discriminatory sensations at the plantar ball of hallux is intact bilateral.   MUSCULOSKELETAL EXAMINATION: Positive for bunion deformity right. Warm first MPJ right without edema.  ASSESSMENT: Onychomycosis Ingrown nail. Tinea pedis. Gout right first MPJ.  PLAN: Reviewed findings and available treatment options. All nails debrided. Advised to scrub dry peeling skin with Salsun blue shampoo daily. Patient wants to wait for a while before asking for oral medication for Gout.

## 2016-12-30 NOTE — Patient Instructions (Signed)
Seen for problem toe nail right great toe. Noted of ingrown mycotic nails on right foot. All debrided. May use Salsun blue shampoo scrub for peeling itch skin.

## 2017-01-02 ENCOUNTER — Ambulatory Visit (INDEPENDENT_AMBULATORY_CARE_PROVIDER_SITE_OTHER): Payer: BLUE CROSS/BLUE SHIELD | Admitting: Cardiovascular Disease

## 2017-01-02 ENCOUNTER — Encounter: Payer: Self-pay | Admitting: Cardiovascular Disease

## 2017-01-02 VITALS — BP 150/85 | HR 64 | Ht 62.0 in | Wt 130.6 lb

## 2017-01-02 DIAGNOSIS — I10 Essential (primary) hypertension: Secondary | ICD-10-CM | POA: Diagnosis not present

## 2017-01-02 DIAGNOSIS — R42 Dizziness and giddiness: Secondary | ICD-10-CM

## 2017-01-02 DIAGNOSIS — E7800 Pure hypercholesterolemia, unspecified: Secondary | ICD-10-CM

## 2017-01-02 DIAGNOSIS — R079 Chest pain, unspecified: Secondary | ICD-10-CM | POA: Diagnosis not present

## 2017-01-02 DIAGNOSIS — R001 Bradycardia, unspecified: Secondary | ICD-10-CM

## 2017-01-02 DIAGNOSIS — Z5181 Encounter for therapeutic drug level monitoring: Secondary | ICD-10-CM

## 2017-01-02 HISTORY — DX: Essential (primary) hypertension: I10

## 2017-01-02 MED ORDER — ROSUVASTATIN CALCIUM 5 MG PO TABS: ORAL_TABLET | ORAL | 5 refills | Status: DC

## 2017-01-02 MED ORDER — LISINOPRIL 10 MG PO TABS: 10.0000 mg | ORAL_TABLET | Freq: Every day | ORAL | 5 refills | Status: DC
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
## 2017-01-02 NOTE — Patient Instructions (Addendum)
Medication Instructions:  START LISINOPRIL 10 MG DAILY  START ROSUVASTATIN 5 MG Monday AND Friday ONLY   STOP RED YEAST RICE  Labwork: BMET IN 1 WEEK AT [facility] WHEN YOU GO TO [facility]   Testing/Procedures: NONE  Follow-Up: Your physician recommends that you schedule a follow-up appointment in: 6 WEEKS  If you need a refill on your cardiac medications before your next appointment, please call your pharmacy.

## 2017-01-02 NOTE — Pre-Procedure Instructions (Addendum)
Phoebe Perch  01/02/2017      [facility] - [place], [place] - [address] AT [facility] OF [address] [place] [address] Phone: [phone] Fax: [phone]    Your procedure is scheduled on Wednesday, 01/11/2017.  Report to [facility] Admitting at 1:45 P.M.  Call this number if you have problems the morning of surgery:  [phone]   Remember:  Do not eat food or drink liquids after midnight Tuesday 01/10/17, except:   Complete all of your Boost Breeze by 11:45am the morning of surgery.   Take these medicines the morning of surgery with A SIP OF WATER: NONE  7 days prior to surgery STOP taking any Aspirin, Aleve, Naproxen, Ibuprofen, Motrin, Advil, Goody's, BC's, all herbal medications, fish oil, and all vitamins    Do not wear jewelry, make-up or nail polish.  Do not wear lotions, powders, or perfumes, or deodorant.  Do not shave 48 hours prior to surgery.    Do not bring valuables to the hospital.  [facility] is not responsible for any belongings or valuables.  Contacts, eyeglasses, dentures or bridgework may not be worn into surgery.  Leave your suitcase in the car.  After surgery it may be brought to your room.  For patients admitted to the hospital, discharge time will be determined by your treatment team.  Patients discharged the day of surgery will not be allowed to drive home.   Name and phone number of your driver:    Special instructions:   [facility]- Preparing For Surgery  Before surgery, you can play an important role. Because skin is not sterile, your skin needs to be as free of germs as possible. You can reduce the number of germs on your skin by washing with CHG (chlorahexidine gluconate) Soap before surgery.  CHG is an antiseptic cleaner which kills germs and bonds with the skin to continue killing germs even after washing.  Please do not use if you have an allergy to CHG or antibacterial  soaps. If your skin becomes reddened/irritated stop using the CHG.  Do not shave (including legs and underarms) for at least 48 hours prior to first CHG shower. It is OK to shave your face.  Please follow these instructions carefully.   1. Shower the NIGHT BEFORE SURGERY and the MORNING OF SURGERY with CHG.   2. If you chose to wash your hair, wash your hair first as usual with your normal shampoo.  3. After you shampoo, rinse your hair and body thoroughly to remove the shampoo.  4. Use CHG as you would any other liquid soap. You can apply CHG directly to the skin and wash gently with a scrungie or a clean washcloth.   5. Apply the CHG Soap to your body ONLY FROM THE NECK DOWN.  Do not use on open wounds or open sores. Avoid contact with your eyes, ears, mouth and genitals (private parts). Wash genitals (private parts) with your normal soap.  6. Wash thoroughly, paying special attention to the area where your surgery will be performed.  7. Thoroughly rinse your body with warm water from the neck down.  8. DO NOT shower/wash with your normal soap after using and rinsing off the CHG Soap.  9. Pat yourself dry with a CLEAN TOWEL.   10. Wear CLEAN PAJAMAS   11. Place CLEAN SHEETS on your bed the night of your first shower and DO  NOT SLEEP WITH PETS.    Day of Surgery: Shower as stated above. Do not apply any deodorants/lotions.  Please wear clean clothes to the hospital/surgery center.      Please read over the following fact sheets that you were given. Pain Booklet, Coughing and Deep Breathing, MRSA Information and Surgical Site Infection Prevention

## 2017-01-03 ENCOUNTER — Encounter (HOSPITAL_COMMUNITY): Payer: Self-pay

## 2017-01-03 ENCOUNTER — Encounter (HOSPITAL_COMMUNITY)
Admission: RE | Admit: 2017-01-03 | Discharge: 2017-01-03 | Disposition: A | Discharge status: Home / Self Care | Payer: BLUE CROSS/BLUE SHIELD | Source: Ambulatory Visit | Attending: General Surgery | Admitting: General Surgery

## 2017-01-03 DIAGNOSIS — Z7982 Long term (current) use of aspirin: Secondary | ICD-10-CM | POA: Insufficient documentation

## 2017-01-03 DIAGNOSIS — Z9889 Other specified postprocedural states: Secondary | ICD-10-CM | POA: Diagnosis not present

## 2017-01-03 DIAGNOSIS — I10 Essential (primary) hypertension: Secondary | ICD-10-CM | POA: Diagnosis not present

## 2017-01-03 DIAGNOSIS — Z87891 Personal history of nicotine dependence: Secondary | ICD-10-CM | POA: Diagnosis not present

## 2017-01-03 DIAGNOSIS — Z90710 Acquired absence of both cervix and uterus: Secondary | ICD-10-CM | POA: Insufficient documentation

## 2017-01-03 DIAGNOSIS — E785 Hyperlipidemia, unspecified: Secondary | ICD-10-CM | POA: Diagnosis not present

## 2017-01-03 DIAGNOSIS — Z01818 Encounter for other preprocedural examination: Secondary | ICD-10-CM | POA: Diagnosis present

## 2017-01-03 HISTORY — DX: Dyspnea, unspecified: R06.00

## 2017-01-03 HISTORY — DX: Unspecified ectopic pregnancy without intrauterine pregnancy: O00.90

## 2017-01-03 HISTORY — DX: Umbilical hernia without obstruction or gangrene: K42.9

## 2017-01-03 HISTORY — DX: Unspecified osteoarthritis, unspecified site: M19.90

## 2017-01-03 HISTORY — DX: Personal history of other medical treatment: Z92.89

## 2017-01-03 LAB — CBC
HCT: 38.2 % (ref 36.0–46.0)
Hemoglobin: 12.8 g/dL (ref 12.0–15.0)
MCH: 29.5 pg (ref 26.0–34.0)
MCHC: 33.5 g/dL (ref 30.0–36.0)
MCV: 88 fL (ref 78.0–100.0)
Platelets: 297 10*3/uL (ref 150–400)
RBC: 4.34 MIL/uL (ref 3.87–5.11)
RDW: 14.8 % (ref 11.5–15.5)
WBC: 5.8 10*3/uL (ref 4.0–10.5)

## 2017-01-03 LAB — BASIC METABOLIC PANEL
Anion gap: 9 (ref 5–15)
BUN: <5 mg/dL — ABNORMAL LOW (ref 6–20)
CO2: 27 mmol/L (ref 22–32)
Calcium: 8.9 mg/dL (ref 8.9–10.3)
Chloride: 103 mmol/L (ref 101–111)
Creatinine, Ser: 0.49 mg/dL (ref 0.44–1.00)
GFR calc Af Amer: >60 mL/min (ref >60)
GFR calc non Af Amer: >60 mL/min (ref >60)
Glucose, Bld: 97 mg/dL (ref 65–99)
Potassium: 3.6 mmol/L (ref 3.5–5.1)
Sodium: 139 mmol/L (ref 135–145)

## 2017-01-03 LAB — SURGICAL PCR SCREEN
MRSA, PCR: NEGATIVE
Staphylococcus aureus: NEGATIVE

## 2017-01-03 NOTE — Progress Notes (Addendum)
PCP - Georgianne Fick Cardiologist - Tiffany [place]  Chest x-ray - not needed EKG - 12/09/16 Stress Test - 12/16/16 ECHO - denies Cardiac Cath - denies  Patient had uncontrolled blood pressure was seen by a cardiologist for bradycardia and blood pressure. Started new pressure medications today.  Blood pressure 125/84 at pat appointment.  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  Also stated that her husband works out of town a lot and is not home a lot of the time.  Patient stated that her daughter would be coming from [place] for her surgery  Patient refuses mupirocin for treatment of staph/MRSA if PCR positive will have to treat day of surgery if positive  Patient denies shortness of breath, fever, cough and chest pain at PAT appointment   Patient verbalized understanding of instructions that were given to them at the PAT appointment. Patient was also instructed that they will need to review over the PAT instructions again at home before surgery.

## 2017-01-04 NOTE — Progress Notes (Signed)
Anesthesia Chart Review: Patient is a 66 year old female scheduled for right breast seed guided excisional biopsy on 01/11/17 by Emelia Loron, MD.  History includes former smoker, HTN, HLD (denied), dyspnea, bradycardia, depression, anxiety, hysterectomy 12/07/11, ectopic pregnancy requiring blood transfusion '81, augmentation mammaplasty, cholecystectomy '02, umbilical hernia. 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  - PCP is Georgianne Fick, MD at [facility]). - Cardiologist is Chilton Si, MD. Last visit 01/02/17 for follow-up chest pain, lightheadedness. Event monitor did not show significant bradycardia and stress test was low risk. Lisinopril was added for HTN, but suspected HTN may be related to stress. Patient denied shortness of breath, fever, cough and chest pain at PAT appointment  Meds include ASA 81 mg daily, ASA 325 mg TID PRN gout pain, lisinopril, Crestor. Lisinopril and Crestor started 01/02/17 by Dr. Duke Salvia. Patient to hold ASA, NSAIDS, herbals/supplements 7 days prior to surgery.  BP 125/84   Pulse 71   Temp 36.7 C   Resp 18   Ht  (1.575 m)   Wt 130 lb 3.2 oz (59.1 kg)   SpO2 99%   BMI 23.81 kg/m   EKG 12/09/16 ([facility]): Marked SB at 43 bpm. Heart rate was 71 bpm at PAT.  Nuclear stress test 12/16/16:  The left ventricular ejection fraction is normal (55-65%).  Nuclear stress EF: 57%.  There was no ST segment deviation noted during stress.  No T wave inversion was noted during stress.  This is a low risk study.  7 day Event monitor 12/15/16-12/22/16: Quality: Fair.  Baseline artifact. Predominant rhythm: sinus rhythm Average heart rate: 60bpm Sinus rhythm, sinus bradycardia and sinus tachycardia. No arrhythmias noted  Preoperative labs noted.   If no acute changes then I anticipate that she can proceed as planned.  Velna [facility] Short Stay Center/Anesthesiology Phone [phone] 01/04/2017 1:24  PM

## 2017-01-10 ENCOUNTER — Ambulatory Visit
Admission: RE | Admit: 2017-01-10 | Discharge: 2017-01-10 | Disposition: A | Discharge status: Home / Self Care | Payer: BLUE CROSS/BLUE SHIELD | Source: Ambulatory Visit | Attending: General Surgery | Admitting: General Surgery

## 2017-01-10 ENCOUNTER — Other Ambulatory Visit: Payer: Self-pay | Admitting: General Surgery

## 2017-01-10 DIAGNOSIS — N6310 Unspecified lump in the right breast, unspecified quadrant: Secondary | ICD-10-CM

## 2017-01-10 LAB — BASIC METABOLIC PANEL
BUN: 13 mg/dL (ref 7–25)
CO2: 28 mmol/L (ref 20–32)
Calcium: 9.2 mg/dL (ref 8.6–10.4)
Chloride: 98 mmol/L (ref 98–110)
Creat: 0.8 mg/dL (ref 0.50–0.99)
Glucose, Bld: 87 mg/dL (ref 65–139)
Potassium: 3.8 mmol/L (ref 3.5–5.3)
Sodium: 134 mmol/L — ABNORMAL LOW (ref 135–146)

## 2017-01-11 ENCOUNTER — Ambulatory Visit
Admission: RE | Admit: 2017-01-11 | Discharge: 2017-01-11 | Disposition: A | Discharge status: Home / Self Care | Payer: BLUE CROSS/BLUE SHIELD | Source: Ambulatory Visit | Attending: General Surgery | Admitting: General Surgery

## 2017-01-11 ENCOUNTER — Encounter (HOSPITAL_COMMUNITY): Payer: Self-pay

## 2017-01-11 ENCOUNTER — Ambulatory Visit (HOSPITAL_COMMUNITY)
Admission: RE | Admit: 2017-01-11 | Discharge: 2017-01-11 | Disposition: A | Discharge status: Home / Self Care | Payer: BLUE CROSS/BLUE SHIELD | Source: Ambulatory Visit | Attending: General Surgery | Admitting: General Surgery

## 2017-01-11 ENCOUNTER — Encounter (HOSPITAL_COMMUNITY)
Admission: RE | Disposition: A | Discharge status: Home / Self Care | Payer: Self-pay | Source: Ambulatory Visit | Attending: General Surgery

## 2017-01-11 ENCOUNTER — Ambulatory Visit (HOSPITAL_COMMUNITY): Payer: BLUE CROSS/BLUE SHIELD | Admitting: Vascular Surgery

## 2017-01-11 ENCOUNTER — Ambulatory Visit (HOSPITAL_COMMUNITY): Payer: BLUE CROSS/BLUE SHIELD | Admitting: Anesthesiology

## 2017-01-11 DIAGNOSIS — Z833 Family history of diabetes mellitus: Secondary | ICD-10-CM | POA: Insufficient documentation

## 2017-01-11 DIAGNOSIS — Z820 Family history of epilepsy and other diseases of the nervous system: Secondary | ICD-10-CM | POA: Diagnosis not present

## 2017-01-11 DIAGNOSIS — F1010 Alcohol abuse, uncomplicated: Secondary | ICD-10-CM | POA: Diagnosis not present

## 2017-01-11 DIAGNOSIS — Z8261 Family history of arthritis: Secondary | ICD-10-CM | POA: Insufficient documentation

## 2017-01-11 DIAGNOSIS — F419 Anxiety disorder, unspecified: Secondary | ICD-10-CM | POA: Diagnosis not present

## 2017-01-11 DIAGNOSIS — Z885 Allergy status to narcotic agent status: Secondary | ICD-10-CM | POA: Insufficient documentation

## 2017-01-11 DIAGNOSIS — Z87891 Personal history of nicotine dependence: Secondary | ICD-10-CM | POA: Diagnosis not present

## 2017-01-11 DIAGNOSIS — Z8249 Family history of ischemic heart disease and other diseases of the circulatory system: Secondary | ICD-10-CM | POA: Insufficient documentation

## 2017-01-11 DIAGNOSIS — Z978 Presence of other specified devices: Secondary | ICD-10-CM | POA: Insufficient documentation

## 2017-01-11 DIAGNOSIS — N6031 Fibrosclerosis of right breast: Secondary | ICD-10-CM | POA: Insufficient documentation

## 2017-01-11 DIAGNOSIS — E7800 Pure hypercholesterolemia, unspecified: Secondary | ICD-10-CM | POA: Diagnosis not present

## 2017-01-11 DIAGNOSIS — Z823 Family history of stroke: Secondary | ICD-10-CM | POA: Diagnosis not present

## 2017-01-11 DIAGNOSIS — Z8349 Family history of other endocrine, nutritional and metabolic diseases: Secondary | ICD-10-CM | POA: Diagnosis not present

## 2017-01-11 DIAGNOSIS — N6310 Unspecified lump in the right breast, unspecified quadrant: Secondary | ICD-10-CM

## 2017-01-11 DIAGNOSIS — Z8371 Family history of colonic polyps: Secondary | ICD-10-CM | POA: Insufficient documentation

## 2017-01-11 DIAGNOSIS — N63 Unspecified lump in unspecified breast: Secondary | ICD-10-CM | POA: Diagnosis present

## 2017-01-11 DIAGNOSIS — N641 Fat necrosis of breast: Secondary | ICD-10-CM | POA: Insufficient documentation

## 2017-01-11 DIAGNOSIS — Z90710 Acquired absence of both cervix and uterus: Secondary | ICD-10-CM | POA: Insufficient documentation

## 2017-01-11 DIAGNOSIS — I10 Essential (primary) hypertension: Secondary | ICD-10-CM | POA: Diagnosis not present

## 2017-01-11 DIAGNOSIS — Z811 Family history of alcohol abuse and dependence: Secondary | ICD-10-CM | POA: Insufficient documentation

## 2017-01-11 DIAGNOSIS — Z888 Allergy status to other drugs, medicaments and biological substances status: Secondary | ICD-10-CM | POA: Insufficient documentation

## 2017-01-11 DIAGNOSIS — Z8379 Family history of other diseases of the digestive system: Secondary | ICD-10-CM | POA: Diagnosis not present

## 2017-01-11 HISTORY — PX: RADIOACTIVE SEED GUIDED EXCISIONAL BREAST BIOPSY: SHX6490

## 2017-01-11 SURGERY — RADIOACTIVE SEED GUIDED BREAST BIOPSY
Anesthesia: General | Site: Breast | Laterality: Right

## 2017-01-11 MED ORDER — SODIUM CHLORIDE 0.9 % IV SOLN: 250.0000 mL | INTRAVENOUS | Status: DC | PRN

## 2017-01-11 MED ORDER — ACETAMINOPHEN 325 MG PO TABS: 325.0000 mg | ORAL_TABLET | ORAL | Status: DC | PRN

## 2017-01-11 MED ORDER — LACTATED RINGERS IV SOLN
INTRAVENOUS | Status: DC | PRN
  Administered 2017-01-11: 16:00:00 via INTRAVENOUS

## 2017-01-11 MED ORDER — ACETAMINOPHEN 160 MG/5ML PO SOLN: 325.0000 mg | ORAL | Status: DC | PRN

## 2017-01-11 MED ORDER — OXYCODONE HCL 5 MG PO TABS: 5.0000 mg | ORAL_TABLET | Freq: Once | ORAL | Status: DC | PRN

## 2017-01-11 MED ORDER — LACTATED RINGERS IV SOLN
INTRAVENOUS | Status: DC
  Administered 2017-01-11: 14:00:00 via INTRAVENOUS

## 2017-01-11 MED ORDER — BUPIVACAINE-EPINEPHRINE (PF) 0.25% -1:200000 IJ SOLN
INTRAMUSCULAR | Status: AC
  Filled 2017-01-11: qty 30

## 2017-01-11 MED ORDER — MEPERIDINE HCL 25 MG/ML IJ SOLN: 6.2500 mg | INTRAMUSCULAR | Status: DC | PRN

## 2017-01-11 MED ORDER — MIDAZOLAM HCL 5 MG/5ML IJ SOLN
INTRAMUSCULAR | Status: DC | PRN
  Administered 2017-01-11: 2 mg via INTRAVENOUS

## 2017-01-11 MED ORDER — TRAMADOL HCL 50 MG PO TABS: 50.0000 mg | ORAL_TABLET | Freq: Four times a day (QID) | ORAL | 0 refills | Status: DC | PRN

## 2017-01-11 MED ORDER — EPHEDRINE SULFATE 50 MG/ML IJ SOLN
INTRAMUSCULAR | Status: DC | PRN
  Administered 2017-01-11 (×3): 10 mg via INTRAVENOUS

## 2017-01-11 MED ORDER — MIDAZOLAM HCL 2 MG/2ML IJ SOLN
INTRAMUSCULAR | Status: AC
  Filled 2017-01-11: qty 2

## 2017-01-11 MED ORDER — BUPIVACAINE-EPINEPHRINE 0.25% -1:200000 IJ SOLN
INTRAMUSCULAR | Status: DC | PRN
  Administered 2017-01-11: 8 mL

## 2017-01-11 MED ORDER — ACETAMINOPHEN 650 MG RE SUPP: 650.0000 mg | RECTAL | Status: DC | PRN

## 2017-01-11 MED ORDER — OXYCODONE HCL 5 MG/5ML PO SOLN: 5.0000 mg | Freq: Once | ORAL | Status: DC | PRN

## 2017-01-11 MED ORDER — DEXAMETHASONE SODIUM PHOSPHATE 10 MG/ML IJ SOLN
INTRAMUSCULAR | Status: DC | PRN
  Administered 2017-01-11: 10 mg via INTRAVENOUS

## 2017-01-11 MED ORDER — KETOROLAC TROMETHAMINE 30 MG/ML IJ SOLN
30.0000 mg | Freq: Once | INTRAMUSCULAR | Status: DC | PRN
Start: 2017-01-11 — End: 2017-01-12

## 2017-01-11 MED ORDER — PROPOFOL 10 MG/ML IV BOLUS
INTRAVENOUS | Status: AC
  Filled 2017-01-11: qty 20

## 2017-01-11 MED ORDER — FENTANYL CITRATE (PF) 100 MCG/2ML IJ SOLN
INTRAMUSCULAR | Status: DC | PRN
  Administered 2017-01-11: 50 ug via INTRAVENOUS

## 2017-01-11 MED ORDER — ACETAMINOPHEN 325 MG PO TABS: 650.0000 mg | ORAL_TABLET | ORAL | Status: DC | PRN

## 2017-01-11 MED ORDER — SODIUM CHLORIDE 0.9 % IV SOLN: INTRAVENOUS | Status: DC

## 2017-01-11 MED ORDER — MORPHINE SULFATE (PF) 2 MG/ML IV SOLN: 2.0000 mg | INTRAVENOUS | Status: DC | PRN

## 2017-01-11 MED ORDER — ONDANSETRON HCL 4 MG/2ML IJ SOLN
INTRAMUSCULAR | Status: DC | PRN
  Administered 2017-01-11: 4 mg via INTRAVENOUS

## 2017-01-11 MED ORDER — EPHEDRINE 5 MG/ML INJ
INTRAVENOUS | Status: AC
  Filled 2017-01-11: qty 10

## 2017-01-11 MED ORDER — FENTANYL CITRATE (PF) 250 MCG/5ML IJ SOLN
INTRAMUSCULAR | Status: AC
Start: 2017-01-11 — End: 2017-01-11
  Filled 2017-01-11: qty 5

## 2017-01-11 MED ORDER — ACETAMINOPHEN 500 MG PO TABS
1000.0000 mg | ORAL_TABLET | ORAL | Status: AC
  Administered 2017-01-11: 1000 mg via ORAL
  Filled 2017-01-11: qty 2

## 2017-01-11 MED ORDER — CEFAZOLIN SODIUM-DEXTROSE 2-4 GM/100ML-% IV SOLN
2.0000 g | INTRAVENOUS | Status: AC
  Administered 2017-01-11: 2 g via INTRAVENOUS
  Filled 2017-01-11: qty 100

## 2017-01-11 MED ORDER — 0.9 % SODIUM CHLORIDE (POUR BTL) OPTIME
TOPICAL | Status: DC | PRN
  Administered 2017-01-11: 1000 mL

## 2017-01-11 MED ORDER — FENTANYL CITRATE (PF) 100 MCG/2ML IJ SOLN: 25.0000 ug | INTRAMUSCULAR | Status: DC | PRN

## 2017-01-11 MED ORDER — GABAPENTIN 300 MG PO CAPS
300.0000 mg | ORAL_CAPSULE | ORAL | Status: AC
  Administered 2017-01-11: 300 mg via ORAL
  Filled 2017-01-11: qty 1

## 2017-01-11 MED ORDER — SODIUM CHLORIDE 0.9% FLUSH: 3.0000 mL | Freq: Two times a day (BID) | INTRAVENOUS | Status: DC

## 2017-01-11 MED ORDER — GLYCOPYRROLATE 0.2 MG/ML IJ SOLN
INTRAMUSCULAR | Status: DC | PRN
Start: 2017-01-11 — End: 2017-01-11
  Administered 2017-01-11: 0.2 mg via INTRAVENOUS

## 2017-01-11 MED ORDER — GABAPENTIN 300 MG PO CAPS
ORAL_CAPSULE | ORAL | Status: AC
  Filled 2017-01-11: qty 1

## 2017-01-11 MED ORDER — SODIUM CHLORIDE 0.9% FLUSH: 3.0000 mL | INTRAVENOUS | Status: DC | PRN

## 2017-01-11 MED ORDER — OXYCODONE HCL 5 MG PO TABS: 5.0000 mg | ORAL_TABLET | ORAL | Status: DC | PRN

## 2017-01-11 MED ORDER — ONDANSETRON HCL 4 MG/2ML IJ SOLN: 4.0000 mg | Freq: Once | INTRAMUSCULAR | Status: DC | PRN

## 2017-01-11 SURGICAL SUPPLY — 54 items
ADH SKN CLS APL DERMABOND .7 (GAUZE/BANDAGES/DRESSINGS) ×1
APPLIER CLIP 9.375 MED OPEN (MISCELLANEOUS)
APR CLP MED 9.3 20 MLT OPN (MISCELLANEOUS)
BINDER BREAST LRG (GAUZE/BANDAGES/DRESSINGS) ×1 IMPLANT
BINDER BREAST XLRG (GAUZE/BANDAGES/DRESSINGS) IMPLANT
BLADE SURG 10 STRL SS (BLADE) ×2 IMPLANT
BLADE SURG 15 STRL LF DISP TIS (BLADE) ×1 IMPLANT
BLADE SURG 15 STRL SS (BLADE) ×2
CANISTER SUCT 3000ML PPV (MISCELLANEOUS) IMPLANT
CHLORAPREP W/TINT 26ML (MISCELLANEOUS) ×2 IMPLANT
CLIP APPLIE 9.375 MED OPEN (MISCELLANEOUS) IMPLANT
COVER PROBE W GEL 5X96 (DRAPES) ×2 IMPLANT
COVER SURGICAL LIGHT HANDLE (MISCELLANEOUS) ×2 IMPLANT
DERMABOND ADVANCED (GAUZE/BANDAGES/DRESSINGS) ×1
DERMABOND ADVANCED .7 DNX12 (GAUZE/BANDAGES/DRESSINGS) ×1 IMPLANT
DEVICE DUBIN SPECIMEN MAMMOGRA (MISCELLANEOUS) ×2 IMPLANT
DRAPE CHEST BREAST 15X10 FENES (DRAPES) ×2 IMPLANT
DRAPE UTILITY XL STRL (DRAPES) ×2 IMPLANT
ELECT COATED BLADE 2.86 ST (ELECTRODE) ×2 IMPLANT
ELECT REM PT RETURN 9FT ADLT (ELECTROSURGICAL) ×2
ELECTRODE REM PT RTRN 9FT ADLT (ELECTROSURGICAL) ×1 IMPLANT
GLOVE BIO SURGEON STRL SZ7 (GLOVE) ×2 IMPLANT
GLOVE BIO SURGEON STRL SZ7.5 (GLOVE) ×2 IMPLANT
GLOVE BIOGEL PI IND STRL 7.0 (GLOVE) IMPLANT
GLOVE BIOGEL PI IND STRL 7.5 (GLOVE) ×1 IMPLANT
GLOVE BIOGEL PI INDICATOR 7.0 (GLOVE) ×2
GLOVE BIOGEL PI INDICATOR 7.5 (GLOVE) ×3
GOWN STRL REUS W/ TWL LRG LVL3 (GOWN DISPOSABLE) ×2 IMPLANT
GOWN STRL REUS W/TWL LRG LVL3 (GOWN DISPOSABLE) ×4
HEMOSTAT ARISTA ABSORB 3G PWDR (MISCELLANEOUS) IMPLANT
ILLUMINATOR WAVEGUIDE N/F (MISCELLANEOUS) IMPLANT
KIT BASIN OR (CUSTOM PROCEDURE TRAY) ×2 IMPLANT
KIT MARKER MARGIN INK (KITS) ×2 IMPLANT
LIGHT WAVEGUIDE WIDE FLAT (MISCELLANEOUS) IMPLANT
MARKER SKIN DUAL TIP RULER LAB (MISCELLANEOUS) ×2 IMPLANT
NDL HYPO 25GX1X1/2 BEV (NEEDLE) ×1 IMPLANT
NEEDLE HYPO 25GX1X1/2 BEV (NEEDLE) ×2 IMPLANT
NS IRRIG 1000ML POUR BTL (IV SOLUTION) IMPLANT
PACK SURGICAL SETUP 50X90 (CUSTOM PROCEDURE TRAY) ×2 IMPLANT
PENCIL BUTTON HOLSTER BLD 10FT (ELECTRODE) ×2 IMPLANT
SPONGE LAP 18X18 X RAY DECT (DISPOSABLE) ×2 IMPLANT
STRIP CLOSURE SKIN 1/2X4 (GAUZE/BANDAGES/DRESSINGS) ×2 IMPLANT
SUT MNCRL AB 4-0 PS2 18 (SUTURE) ×2 IMPLANT
SUT SILK 2 0 SH (SUTURE) IMPLANT
SUT VIC AB 2-0 SH 27 (SUTURE) ×2
SUT VIC AB 2-0 SH 27XBRD (SUTURE) ×1 IMPLANT
SUT VIC AB 3-0 SH 27 (SUTURE) ×2
SUT VIC AB 3-0 SH 27X BRD (SUTURE) ×1 IMPLANT
SYR BULB 3OZ (MISCELLANEOUS) ×2 IMPLANT
SYR CONTROL 10ML LL (SYRINGE) ×2 IMPLANT
TOWEL OR 17X24 6PK STRL BLUE (TOWEL DISPOSABLE) ×2 IMPLANT
TOWEL OR 17X26 10 PK STRL BLUE (TOWEL DISPOSABLE) ×2 IMPLANT
TUBE CONNECTING 12X1/4 (SUCTIONS) IMPLANT
YANKAUER SUCT BULB TIP NO VENT (SUCTIONS) IMPLANT

## 2017-01-11 NOTE — Op Note (Signed)
Preoperative diagnoses: Right breast mammographic distortion and calcifications times two Postoperative diagnosis: Same as above Procedure: Rightbreast seed (x2) guided excisional biopsy Surgeon: Dr. Harden Mo Anesthesia: Gen. Estimated blood loss: minimal Complications: None Drains: None Specimens: Rightbreast tissue marked with paint x2 (one containing seed, other seed separate), additional right breast tissue/capsule unoriented Sponge and needle count correct at completion Disposition to recovery stable  Indications: This is a 52 yof with right breast abnormalities on mm.  She is unable to undergo core biopsy.  We discussed seed guided excision and there were two seeds placed to mark these areas.    Procedure: After informed consent was obtained she was then taken to the operating room. She was given ancef. Sequential compression devices were on her legs. She was placed under general anesthesia without complication. Her chestwas then prepped and draped in the standard sterile surgical fashion. A surgical timeout was then performed.  The seeds were in the upper outer right breast. I took care to avoid injuring the implant throughout the case.  I infiltrated marcaine in the area and then made an incision. I then used the neoprobe to guided excision of the first seed.  The tissue was very hard and adherent to the implant capsule.  the seed came out of the specimen and was sent separate. I then removed this breast tissue and marked with paint. I then removed the second seed and the surrounding tissue.  This again was adherent to the capsule. I did enter the capsule twice.  I also removed a small hard nodule on the capsul and sent this.   Mammogram confirmed removal of both seeds. This was then all sent to pathology. Hemostasis was observed. I closed the breast implant capsule with 3-0 vicryl. The skin was closed with 4-0 monocryl. Dermabond and steristrips were placed on the incision. A  breast binder was placed. She was transferred to recovery stable

## 2017-01-11 NOTE — Discharge Instructions (Signed)
[facility] °Office Phone Number [phone] °POST OP INSTRUCTIONS ° °Always review your discharge instruction sheet given to you by the facility where your surgery was performed. ° °IF YOU HAVE DISABILITY OR FAMILY LEAVE FORMS, YOU MUST BRING THEM TO THE OFFICE FOR PROCESSING.  DO NOT GIVE THEM TO YOUR DOCTOR. ° °1. A prescription for pain medication may be given to you upon discharge.  Take your pain medication as prescribed, if needed.  If narcotic pain medicine is not needed, then you may take acetaminophen (Tylenol), naprosyn (Alleve) or ibuprofen (Advil) as needed. °2. Take your usually prescribed medications unless otherwise directed °3. If you need a refill on your pain medication, please contact your pharmacy.  They will contact our office to request authorization.  Prescriptions will not be filled after 5pm or on week-ends. °4. You should eat very light the first 24 hours after surgery, such as soup, crackers, pudding, etc.  Resume your normal diet the day after surgery. °5. Most patients will experience some swelling and bruising in the breast.  Ice packs and a good support bra will help.  Wear the breast binder provided or a sports bra for 72 hours day and night.  After that wear a sports bra during the day until you return to the office. Swelling and bruising can take several days to resolve.  °6. It is common to experience some constipation if taking pain medication after surgery.  Increasing fluid intake and taking a stool softener will usually help or prevent this problem from occurring.  A mild laxative (Milk of Magnesia or Miralax) should be taken according to package directions if there are no bowel movements after 48 hours. °7. Unless discharge instructions indicate otherwise, you may remove your bandages 48 hours after surgery and you may shower at that time.  You may have steri-strips (small skin tapes) in place directly over the incision.  These strips should be left on the  skin for 7-10 days and will come off on their own.  If your surgeon used skin glue on the incision, you may shower in 24 hours.  The glue will flake off over the next 2-3 weeks.  Any sutures or staples will be removed at the office during your follow-up visit. °8. ACTIVITIES:  You may resume regular daily activities (gradually increasing) beginning the next day.  Wearing a good support bra or sports bra minimizes pain and swelling.  You may have sexual intercourse when it is comfortable. °a. You may drive when you no longer are taking prescription pain medication, you can comfortably wear a seatbelt, and you can safely maneuver your car and apply brakes. °b. RETURN TO WORK:  ______________________________________________________________________________________ °9. You should see your doctor in the office for a follow-up appointment approximately two weeks after your surgery.  Your doctor’s nurse will typically make your follow-up appointment when she calls you with your pathology report.  Expect your pathology report 3-4 business days after your surgery.  You may call to check if you do not hear from us after three days. °10. OTHER INSTRUCTIONS: _______________________________________________________________________________________________ _____________________________________________________________________________________________________________________________________ °_____________________________________________________________________________________________________________________________________ °_____________________________________________________________________________________________________________________________________ ° °WHEN TO CALL DR Jahon Bart: °1. Fever over 101.0 °2. Nausea and/or vomiting. °3. Extreme swelling or bruising. °4. Continued bleeding from incision. °5. Increased pain, redness, or drainage from the incision. ° °The clinic staff is available to answer your questions during regular  business hours.  Please don’t hesitate to call and ask to speak to one of the nurses for clinical concerns.  If you   have a medical emergency, go to the nearest emergency room or call 911.  A surgeon from [facility] is always on call at the hospital. ° °For further questions, please visit [email or web address] ° °

## 2017-01-11 NOTE — Anesthesia Postprocedure Evaluation (Signed)
Anesthesia Post Note  Patient: Amanda [name]  Procedure(s) Performed: RADIOACTIVE SEED GUIDED EXCISIONAL BREAST BIOPSY (Right Breast)     Patient location during evaluation: PACU Anesthesia Type: General Level of consciousness: awake Pain management: pain level controlled Vital Signs Assessment: post-procedure vital signs reviewed and stable Respiratory status: spontaneous breathing Cardiovascular status: stable Anesthetic complications: no    Last Vitals:  Vitals:   01/11/17 1711 01/11/17 1713  BP: 123/66 131/65  Pulse: (!) 58   Resp: 17   Temp: (!) 36.2 C (!) 36.2 C  SpO2: 99%     Last Pain:  Vitals:   01/11/17 1711  TempSrc:   PainSc: 0-No pain                 Arleigh Odowd

## 2017-01-11 NOTE — H&P (Signed)
66 yof referred by Dr Nicholos Johns for a right breast asymmetry. she is a former Development worker, community. she has history in 1981 of silicone implants with rupture in 1994. she had both implants out then with residual silicone in the right side. she now has saline implants since then. she has no fh. she has no mass or nipple dc. she does recently have a cards appt for ekg changes and will need to discuss with them. she was noted on screening mm to have c density breasts. she has right breast asymmetry with calcifications. further imaging shows a asymmetry measuring about 4 cm with a 6 mm group of calcifications as well. US showed nothing. she was unable to undergo core biopsy due to thin breast tissue and is referred for excisional biopsy.  Past Surgical History (Tanisha A. Manson Passey, RMA; 12/23/2016 9:42 AM) Breast Augmentation  Bilateral. Breast Biopsy  Right. Gallbladder Surgery - Laparoscopic  Hemorrhoidectomy  Hysterectomy (not due to cancer) - Complete  Oral Surgery   Diagnostic Studies History (Tanisha A. Manson Passey, RMA; 12/23/2016 9:42 AM) Colonoscopy  >10 years ago Mammogram  within last year Pap Smear  1-5 years ago  Allergies (Tanisha A. Manson Passey, RMA; 12/23/2016 9:45 AM) Codeine Phosphate *ANALGESICS - OPIOID*  Nausea, Vomiting. Medrol *CORTICOSTEROIDS*  Rash. Allergies Reconciled   Medication History (Tanisha A. Manson Passey, RMA; 12/23/2016 9:46 AM) Aspirin (  Tablet, Oral) Active. Calcium (Oral) Specific strength unknown - Active. Red Yeast Rice (  Capsule, Oral) Active. Multiple Vitamins (Oral) Active. Medications Reconciled  Social History (Tanisha A. Manson Passey, RMA; 12/23/2016 9:42 AM) Alcohol use  Recently quit alcohol use. Caffeine use  Carbonated beverages, Coffee, Tea. No drug use  Tobacco use  Former smoker.  Family History (Tanisha A. Manson Passey, RMA; 12/23/2016 9:42 AM) Alcohol Abuse  Brother, Family Members In General, Mother. Arthritis  Brother, Family  Members In General, Father, Mother, Sister. Cerebrovascular Accident  Father. Colon Polyps  Mother. Depression  Mother, Sister. Diabetes Mellitus  Family Members In General, Father. Heart Disease  Family Members In General, Father. Heart disease in female family member before age 3  Hypertension  Brother, Family Members In [place], Father. Ischemic Bowel Disease  Mother. Migraine Headache  Daughter. Respiratory Condition  Family Members In General. Thyroid problems  Mother.  Pregnancy / Birth History (Tanisha A. Manson Passey, RMA; 12/23/2016 9:42 AM) Age at menarche  16 years. Age of menopause  53-55 Gravida  5 Length (months) of breastfeeding  7-12 Maternal age  50-20 Para  2  Other Problems (Tanisha A. Manson Passey, RMA; 12/23/2016 9:42 AM) Alcohol Abuse  Anxiety Disorder  Cholelithiasis  Hemorrhoids  High blood pressure  Hypercholesterolemia  Lump In Breast  Transfusion history  Ventral Hernia Repair   Review of Systems (Tanisha A. Brown RMA; 12/23/2016 9:42 AM) General Not Present- Appetite Loss, Chills, Fatigue, Fever, Night Sweats, Weight Gain and Weight Loss. Skin Not Present- Change in Wart/Mole, Dryness, Hives, Jaundice, New Lesions, Non-Healing Wounds, Rash and Ulcer. HEENT Present- Wears glasses/contact lenses. Not Present- Earache, Hearing Loss, Hoarseness, Nose Bleed, Oral Ulcers, Ringing in the Ears, Seasonal Allergies, Sinus Pain, Sore Throat, Visual Disturbances and Yellow Eyes. Breast Present- Breast Mass. Not Present- Breast Pain, Nipple Discharge and Skin Changes. Gastrointestinal Present- Hemorrhoids. Not Present- Abdominal Pain, Bloating, Bloody Stool, Change in Bowel Habits, Chronic diarrhea, Constipation, Difficulty Swallowing, Excessive gas, Gets full quickly at meals, Indigestion, Nausea, Rectal Pain and Vomiting. Female Genitourinary Not Present- Frequency, Nocturia, Painful Urination, Pelvic Pain and Urgency. Musculoskeletal Present- Joint  Pain, Joint Stiffness  and Swelling of Extremities. Not Present- Back Pain, Muscle Pain and Muscle Weakness. Neurological Not Present- Decreased Memory, Fainting, Headaches, Numbness, Seizures, Tingling, Tremor, Trouble walking and Weakness. Psychiatric Present- Anxiety. Not Present- Bipolar, Change in Sleep Pattern, Depression, Fearful and Frequent crying. Endocrine Not Present- Cold Intolerance, Excessive Hunger, Hair Changes, Heat Intolerance, Hot flashes and New Diabetes. Hematology Not Present- Blood Thinners, Easy Bruising, Excessive bleeding, Gland problems, HIV and Persistent Infections.  Vitals (Tanisha A. Brown RMA; 12/23/2016 9:44 AM) 12/23/2016 9:44 AM Weight: 131.2 lb Height: 62in Body Surface Area: 1.6 m Body Mass Index: 24 kg/m  Temp.: 98.68F  Pulse: 77 (Regular)  P.OX: 98% (Room air) BP: 134/82 (Sitting, Left Arm, Standard) Physical Exam Emelia Loron MD; 12/27/2016 10:10 AM) General Mental Status-Alert. Orientation-Oriented X3. Head and Neck Trachea-midline. Thyroid Gland Characteristics - normal size and consistency. Eye Sclera/Conjunctiva - Bilateral-No scleral icterus. Chest and Lung Exam Chest and lung exam reveals -quiet, even and easy respiratory effort with no use of accessory muscles and on auscultation, normal breath sounds, no adventitious sounds and normal vocal resonance. Breast Nipples-No Discharge. Breast Lump-No Palpable Breast Mass. Note: bilateral implants in place Cardiovascular Cardiovascular examination reveals -normal heart sounds, regular rate and rhythm with no murmurs. Lymphatic Head & Neck General Head & Neck Lymphatics: Bilateral - Description - Normal. Axillary General Axillary Region: Bilateral - Description - Normal. Note: no [place] adenopathy  Assessment & Plan Emelia Loron MD; 12/27/2016 10:14 AM) ABNORMAL MAMMOGRAM OF RIGHT BREAST (R92.8) Story: Right breast x2  seed guided excisional  biopsy this may very well be related to prior breast issues with implants. with inability to do core biopsy I think excisional biopsy of calcs and asymmetry reasonable. we discussed seed guided excision with risks/recovery as well. we discussed risk to implant specifically

## 2017-01-11 NOTE — Anesthesia Postprocedure Evaluation (Signed)
Anesthesia Post Note  Patient: Amanda [name]  Procedure(s) Performed: RADIOACTIVE SEED GUIDED EXCISIONAL BREAST BIOPSY (Right Breast)     Patient location during evaluation: PACU Anesthesia Type: General Level of consciousness: awake Pain management: pain level controlled Vital Signs Assessment: post-procedure vital signs reviewed and stable Respiratory status: spontaneous breathing Cardiovascular status: stable Anesthetic complications: no    Last Vitals:  Vitals:   01/11/17 1711 01/11/17 1713  BP: 123/66 131/65  Pulse: (!) 58   Resp: 17   Temp: (!) 36.2 C (!) 36.2 C  SpO2: 99%     Last Pain:  Vitals:   01/11/17 1713  TempSrc:   PainSc: 0-No pain                 [name]

## 2017-01-11 NOTE — Interval H&P Note (Signed)
History and Physical Interval Note:  01/11/2017 3:19 PM  Amanda [name]  has presented today for surgery, with the diagnosis of RIGHT BREAST CALCFICATIONS  The various methods of treatment have been discussed with the patient and family. After consideration of risks, benefits and other options for treatment, the patient has consented to  Procedure(s): RADIOACTIVE SEED GUIDED EXCISIONAL BREAST BIOPSY (Right) as a surgical intervention .  The patient's history has been reviewed, patient examined, no change in status, stable for surgery.  I have reviewed the patient's chart and labs.  Questions were answered to the patient's satisfaction.     Amanda [name]

## 2017-01-11 NOTE — Anesthesia Preprocedure Evaluation (Signed)
Anesthesia Evaluation  Patient identified by MRN, date of birth, ID band Patient awake    Reviewed: Allergy & Precautions, NPO status , Patient's Chart, lab work & pertinent test results  Airway Mallampati: I       Dental no notable dental hx. (+) Teeth Intact   Pulmonary former smoker,    Pulmonary exam normal breath sounds clear to auscultation       Cardiovascular hypertension, Pt. on medications Normal cardiovascular exam Rhythm:Regular Rate:Normal     Neuro/Psych    GI/Hepatic negative GI ROS, Neg liver ROS,   Endo/Other  negative endocrine ROS  Renal/GU negative Renal ROS  negative genitourinary   Musculoskeletal   Abdominal Normal abdominal exam  (+)   Peds  Hematology negative hematology ROS (+)   Anesthesia Other Findings   Reproductive/Obstetrics                             Anesthesia Physical Anesthesia Plan  ASA: II  Anesthesia Plan: General   Post-op Pain Management:    Induction: Intravenous  PONV Risk Score and Plan: 3 and Ondansetron, Dexamethasone and Midazolam  Airway Management Planned: LMA  Additional Equipment:   Intra-op Plan:   Post-operative Plan:   Informed Consent: I have reviewed the patients History and Physical, chart, labs and discussed the procedure including the risks, benefits and alternatives for the proposed anesthesia with the patient or authorized representative who has indicated his/her understanding and acceptance.   Dental advisory given  Plan Discussed with: CRNA and Surgeon  Anesthesia Plan Comments:         Anesthesia Quick Evaluation

## 2017-01-11 NOTE — Transfer of Care (Signed)
Immediate Anesthesia Transfer of Care Note  Patient: Amanda [name]  Procedure(s) Performed: RADIOACTIVE SEED GUIDED EXCISIONAL BREAST BIOPSY (Right Breast)  Patient Location: PACU  Anesthesia Type:General  Level of Consciousness: awake  Airway & Oxygen Therapy: Patient Spontanous Breathing  Post-op Assessment: Report given to RN and Post -op Vital signs reviewed and stable  Post vital signs: Reviewed and stable  Last Vitals:  Vitals:   01/11/17 1402 01/11/17 1653  BP: (!) 134/54 135/66  Pulse:  63  Resp:  14  Temp:  (!) 36.2 C  SpO2:  98%    Last Pain:  Vitals:   01/11/17 1653  TempSrc:   PainSc: 0-No pain         Complications: No apparent anesthesia complications

## 2017-01-11 NOTE — Anesthesia Procedure Notes (Signed)
Procedure Name: LMA Insertion Date/Time: 01/11/2017 3:51 PM Performed by: Gavin Pound, Dahir Ayer J Pre-anesthesia Checklist: Patient identified, Emergency Drugs available, Suction available, Patient being monitored and Timeout performed Patient Re-evaluated:Patient Re-evaluated prior to induction Oxygen Delivery Method: Circle system utilized Preoxygenation: Pre-oxygenation with 100% oxygen Induction Type: IV induction Ventilation: Mask ventilation without difficulty LMA: LMA inserted LMA Size: 4.0 Number of attempts: 1 Placement Confirmation: positive ETCO2 and breath sounds checked- equal and bilateral Tube secured with: Tape Dental Injury: Teeth and Oropharynx as per pre-operative assessment

## 2017-01-12 ENCOUNTER — Encounter (HOSPITAL_COMMUNITY): Payer: Self-pay | Admitting: General Surgery

## 2017-02-16 ENCOUNTER — Ambulatory Visit: Payer: BLUE CROSS/BLUE SHIELD | Admitting: Cardiovascular Disease

## 2017-04-18 ENCOUNTER — Ambulatory Visit: Payer: BLUE CROSS/BLUE SHIELD | Admitting: Podiatry

## 2017-06-27 DIAGNOSIS — H40033 Anatomical narrow angle, bilateral: Secondary | ICD-10-CM | POA: Diagnosis not present

## 2017-06-27 DIAGNOSIS — H2513 Age-related nuclear cataract, bilateral: Secondary | ICD-10-CM | POA: Diagnosis not present

## 2017-07-03 DIAGNOSIS — M67371 Transient synovitis, right ankle and foot: Secondary | ICD-10-CM | POA: Diagnosis not present

## 2017-07-03 DIAGNOSIS — F411 Generalized anxiety disorder: Secondary | ICD-10-CM | POA: Diagnosis not present

## 2017-07-03 DIAGNOSIS — E782 Mixed hyperlipidemia: Secondary | ICD-10-CM | POA: Diagnosis not present

## 2017-07-03 DIAGNOSIS — M19071 Primary osteoarthritis, right ankle and foot: Secondary | ICD-10-CM | POA: Diagnosis not present

## 2017-07-03 DIAGNOSIS — M8580 Other specified disorders of bone density and structure, unspecified site: Secondary | ICD-10-CM | POA: Diagnosis not present

## 2017-07-03 DIAGNOSIS — F322 Major depressive disorder, single episode, severe without psychotic features: Secondary | ICD-10-CM | POA: Diagnosis not present

## 2017-07-03 DIAGNOSIS — M7751 Other enthesopathy of right foot: Secondary | ICD-10-CM | POA: Diagnosis not present

## 2017-07-06 DIAGNOSIS — H2511 Age-related nuclear cataract, right eye: Secondary | ICD-10-CM | POA: Diagnosis not present

## 2017-07-12 ENCOUNTER — Other Ambulatory Visit: Payer: Self-pay | Admitting: Cardiovascular Disease

## 2017-07-12 MED ORDER — LISINOPRIL 10 MG PO TABS: 10.0000 mg | ORAL_TABLET | Freq: Every day | ORAL | 1 refills | Status: DC

## 2017-10-07 ENCOUNTER — Encounter (HOSPITAL_COMMUNITY): Payer: Self-pay

## 2017-10-07 ENCOUNTER — Emergency Department (HOSPITAL_COMMUNITY)
Admission: EM | Admit: 2017-10-07 | Discharge: 2017-10-07 | Payer: BLUE CROSS/BLUE SHIELD | Attending: Emergency Medicine | Admitting: Emergency Medicine

## 2017-10-07 ENCOUNTER — Other Ambulatory Visit: Payer: Self-pay

## 2017-10-07 DIAGNOSIS — I10 Essential (primary) hypertension: Secondary | ICD-10-CM | POA: Diagnosis not present

## 2017-10-07 DIAGNOSIS — R030 Elevated blood-pressure reading, without diagnosis of hypertension: Secondary | ICD-10-CM | POA: Insufficient documentation

## 2017-10-07 DIAGNOSIS — Z87891 Personal history of nicotine dependence: Secondary | ICD-10-CM | POA: Insufficient documentation

## 2017-10-07 DIAGNOSIS — Z7982 Long term (current) use of aspirin: Secondary | ICD-10-CM | POA: Diagnosis not present

## 2017-10-07 DIAGNOSIS — Z79899 Other long term (current) drug therapy: Secondary | ICD-10-CM | POA: Insufficient documentation

## 2017-10-07 DIAGNOSIS — F10929 Alcohol use, unspecified with intoxication, unspecified: Secondary | ICD-10-CM | POA: Diagnosis present

## 2017-10-07 DIAGNOSIS — F10920 Alcohol use, unspecified with intoxication, uncomplicated: Secondary | ICD-10-CM

## 2017-10-07 MED ORDER — SODIUM CHLORIDE 0.9 % IV BOLUS: 1000.0000 mL | Freq: Once | INTRAVENOUS | Status: DC

## 2017-10-07 NOTE — ED Provider Notes (Signed)
Medical screening examination/treatment/procedure(s) were conducted as a shared visit with non-physician practitioner(s) and myself.  I personally evaluated the patient during the encounter.  None 67 year old female here with obvious acute intoxication who presents combative and uncooperative.  Attempted to speak with patient but she is very belligerent.  Patient appears medically stable to be discharged with the police department   Lorre NickAllen, Reshard Guillet, MD 10/07/17 1754
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
## 2017-10-07 NOTE — ED Notes (Signed)
Patient refused lab work and IV Fluid.
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
## 2017-10-07 NOTE — Discharge Instructions (Addendum)
Please help with your primary care provider next week.  Please discuss alcohol use with your provider.  Please return the emergency department any chest pain, shortness of breath, dizziness or lightheadedness.  Thank you for allowing us to participate in your care today.

## 2017-10-07 NOTE — ED Provider Notes (Signed)
[facility]-EMERGENCY DEPT Provider Note   CSN: [id number] Arrival date & time: 10/07/17  1525     History   Chief Complaint Chief Complaint  Patient presents with  . Alcohol Intoxication    HPI Amanda [name] is a 67 y.o. female.  HPI  Patient is a 67 year old female with a history of hypertension, hyperlipidemia, alcohol use, presenting for intoxication.  Patient is resistant to provide any historical details, but does only offer up that she asked her friend to call for help.  Patient declined to answer how much she drank today.  Patient does offer up that she does not have any pain, and did not fall today.  Level 5 caveat intoxication.  Past Medical History:  Diagnosis Date  . Anxiety   . Arthritis   . Bradycardia 12/18/2016  . Chest pain 12/18/2016   patient denies  . Depression   . Dyspnea   . Ear infection    history  . Ectopic pregnancy   . Essential hypertension 01/02/2017   new diagnosiss taking lisinopril  . History of blood transfusion 1981   etopic pregnancy   . Hyperlipidemia 12/18/2016  . Hypertension   . Lightheadedness 12/18/2016  . Umbilical hernia     Patient Active Problem List   Diagnosis Date Noted  . Essential hypertension 01/02/2017  . Hyperlipidemia 12/18/2016  . Chest pain 12/18/2016  . Lightheadedness 12/18/2016  . Bradycardia 12/18/2016  . Alcohol dependence (HCC) 06/13/2012  . Alcohol withdrawal (HCC) 06/13/2012  . Major depression 06/13/2012  . Generalized anxiety disorder 06/13/2012    Past Surgical History:  Procedure Laterality Date  . ABDOMINAL HYSTERECTOMY    . AUGMENTATION MAMMAPLASTY Bilateral   . BREAST SURGERY    . CHOLECYSTECTOMY  2002  . colonscopy  11/2002  . DILATION AND CURETTAGE OF UTERUS  09/2007   hysteroscopy for fibroid  . HEMORRHOID SURGERY  08/2004  . LAPAROSCOPIC ASSISTED VAGINAL HYSTERECTOMY  12/07/2011   Procedure: LAPAROSCOPIC ASSISTED VAGINAL HYSTERECTOMY;  Surgeon: Geryl RankinsEvelyn Varnado,  MD;  Location: [facility];  Service: Gynecology;  Laterality: N/A;  . RADIOACTIVE SEED GUIDED EXCISIONAL BREAST BIOPSY Right 01/11/2017   Procedure: RADIOACTIVE SEED GUIDED EXCISIONAL BREAST BIOPSY;  Surgeon: Emelia LoronWakefield, Matthew, MD;  Location[facility];  Service: General;  Laterality: Right;  . UPPER GASTROINTESTINAL ENDOSCOPY  11/2002     OB History   None      Home Medications    Prior to Admission medications   Medication Sig Start Date End Date Taking? Authorizing Provider  aspirin 325 MG tablet Take 325 mg by mouth 3 (three) times daily as needed for mild pain (GOUT PAIN).    [provider]  aspirin EC 81 MG tablet Take 81 mg by mouth daily.    [provider]  Calcium Carb-Cholecalciferol (CALCIUM 1000 + D PO) Take 1 tablet by mouth daily.    [provider]  glucosamine-chondroitin 500-400 MG tablet Take 1 tablet by mouth daily.    [provider]  lisinopril (PRINIVIL,ZESTRIL) 10 MG tablet Take 1 tablet (10 mg total) by mouth daily. PLEASE SCHEDULE OFFICE VISIT 07/12/17 10/10/17  Chilton Siandolph, Tiffany, MD  Multiple Vitamin (MULTIVITAMIN WITH MINERALS) TABS tablet Take 1 tablet by mouth daily.    [provider]  rosuvastatin (CRESTOR) 5 MG tablet TAKE 1 BY MOUTH ON Monday AND Friday ONLY OR AS DIRECTED 01/02/17   Chilton Siandolph, Tiffany, MD  traMADol (ULTRAM) 50 MG tablet Take 1 tablet (50 mg total) by mouth every 6 (  six) hours as needed. 01/11/17   Emelia Loron, MD    Family History Family History  Problem Relation Age of Onset  . Hyperlipidemia Mother   . Crohn's disease Mother   . Dementia Mother   . Hyperlipidemia Father   . CAD Father   . Hyperlipidemia Sister   . Hypertension Sister   . Hypertension Brother   . Hyperlipidemia Brother   . Hyperlipidemia Daughter   . CVA Maternal Grandmother   . CVA Maternal Grandfather   . Stomach cancer Paternal Grandmother     Social History Social History   Tobacco Use  . Smoking status:  Former Games developer  . Smokeless tobacco: Never Used  . Tobacco comment: "smoked for 10 years but stopped a long time ago"  Substance Use Topics  . Alcohol use: Yes    Alcohol/week: 2.4 oz    Types: 4 Glasses of wine per week  . Drug use: No     Allergies   Codeine and Medrol [methylprednisolone]   Review of Systems Review of Systems  Musculoskeletal: Negative for arthralgias, back pain and neck pain.  Neurological: Negative for syncope.   Level 5 caveat unable to perform further, as patient refuses to answer.  Physical Exam Updated Vital Signs BP (!) 142/81   Pulse 86   Resp 16   Ht 5\' 6"  (1.676 m)   Wt 59 kg (130 lb)   SpO2 97%   BMI 20.98 kg/m   Physical Exam  Constitutional: She appears well-developed and well-nourished. No distress.  HENT:  Head: Normocephalic and atraumatic.  Mouth/Throat: Oropharynx is clear and moist.  No contusions or lesions of the scalp.  Eyes: Pupils are equal, round, and reactive to light. Conjunctivae and EOM are normal.  Neck: Normal range of motion. Neck supple.  No midline cervical spine tenderness.  Cardiovascular: Normal rate, regular rhythm, S1 normal and S2 normal.  No murmur heard. Pulmonary/Chest: Effort normal and breath sounds normal. She has no wheezes. She has no rales.  Abdominal: Soft. She exhibits no distension. There is no tenderness. There is no guarding.  Musculoskeletal: Normal range of motion. She exhibits no edema or deformity.  Neurological: She is alert.  Cranial nerves grossly intact. Normal speech and phonation.  No facial droop.  Patient perseverates on certain details of her experiences today, but does not appear to be responding to internal stimuli. Symmetric movements of all extremities.  Skin: Skin is warm and dry. No rash noted. No erythema.  Psychiatric: She has a normal mood and affect. Her behavior is normal. Judgment and thought content normal.  Nursing note and vitals reviewed.  ED Treatments /  Results  Labs (all labs ordered are listed, but only abnormal results are displayed) Labs Reviewed  ETHANOL    EKG None  Radiology No results found.  Procedures Procedures (including critical care time)  Medications Ordered in ED Medications  sodium chloride 0.9 % bolus 1,000 mL (has no administration in time range)     Initial Impression / Assessment and Plan / ED Course  I have reviewed the triage vital signs and the nursing notes.  Pertinent labs & imaging results that were available during my care of the patient were reviewed by me and considered in my medical decision making (see chart for details).  Clinical Course as of Oct 07 1812  Sat Oct 07, 2017  1708 Notified by RN that patient is refusing fluid repletion and lab draws.  Will provide patient with oral repletion.   [  AM]  1752 Patient acutely agitated, and being physically aggressive towards police officers.  Patient ambulating symmetrically and without dysmetria or ataxia.  Neurologically intact.  Speech is normal.   [AM]    Clinical Course User Index [AM] Elisha Ponder, PA-C    Patient nontoxic-appearing, physically intoxicated.  Patient is refusing all interventions.  Patient is belligerent, and arguing with staff at every reassurance that they are trying to help her.  Patient is ambulatory, moving all extremities symmetrically, with normal speech information.  There is no evidence of head trauma, therefore do not suspect acute intracranial bleeding.  Patient while intoxicated appears to have capacity to be able to perform her own medical decision making.  Patient attempted to intervene in the resuscitation of the patient, as she states she is a Engineer, civil (consulting), and at that point decision was made that patient be removed from the emergency department and taking to the county jail for sobriety.  Patient informed of this and escorted out of emergency department with police.  Final Clinical Impressions(s) / ED Diagnoses    Final diagnoses:  Alcoholic intoxication without complication (HCC)  Elevated blood pressure reading with diagnosis of hypertension    ED Discharge Orders    None       Delia Chimes 10/07/17 1821    Lorre Nick, MD 10/08/17 1735

## 2017-10-07 NOTE — ED Notes (Signed)
Bed: [facility] Expected date: 10/07/17 Expected time: 3:28 PM Means of arrival: Ambulance Comments: ETOH uncooperative

## 2018-01-04 ENCOUNTER — Other Ambulatory Visit: Payer: Self-pay | Admitting: Cardiovascular Disease

## 2018-01-04 MED ORDER — ROSUVASTATIN CALCIUM 5 MG PO TABS: ORAL_TABLET | ORAL | 0 refills | Status: DC

## 2018-01-04 NOTE — Telephone Encounter (Signed)
° ° °  Call from [place] at [employer]. Patient is out of town, Requesting [facility]ill [facility] [place].      1. Which medications need to be refilled? (please list name of each medication and dose if known) rosuvastatin (CRESTOR) 5 MG tablet  2. Which pharmacy/location (including street and city if local pharmacy) is medication to be sent to? [facility] [phone], fax [phone]  3. Do they need a 30 day or 90 day supply? 30

## 2018-01-19 ENCOUNTER — Other Ambulatory Visit: Payer: Self-pay | Admitting: Cardiovascular Disease

## 2018-01-29 DIAGNOSIS — F322 Major depressive disorder, single episode, severe without psychotic features: Secondary | ICD-10-CM | POA: Diagnosis not present

## 2018-01-29 DIAGNOSIS — F411 Generalized anxiety disorder: Secondary | ICD-10-CM | POA: Diagnosis not present

## 2018-03-13 ENCOUNTER — Telehealth: Payer: Self-pay | Admitting: *Deleted

## 2018-03-13 NOTE — Telephone Encounter (Signed)
Agree.  She needs follow up.

## 2018-03-13 NOTE — Telephone Encounter (Signed)
Pharmacy calling to new rx for Rosuvastatin refilled for what patient says is a new dose.  I let the pharmacy know patient has not been seen in the office in a little over a year as the same with labs. I do not see in Epic where anyone has told patient recently to change her dosage of Rosuvastatin.  Please advise.

## 2018-03-13 NOTE — Telephone Encounter (Signed)
Unable to reach patient or patient's husband. Talked with the pharmacy tech Colin [facility]Brianna [facility]. I let her know Dr. Duke Salviaandolph would like the patient to come in for an appointment for further med changes of her Rosuvastatin.

## 2018-10-23 DIAGNOSIS — M25572 Pain in left ankle and joints of left foot: Secondary | ICD-10-CM | POA: Diagnosis not present

## 2018-10-23 DIAGNOSIS — L129 Pemphigoid, unspecified: Secondary | ICD-10-CM | POA: Diagnosis not present

## 2018-10-23 DIAGNOSIS — S8265XA Nondisplaced fracture of lateral malleolus of left fibula, initial encounter for closed fracture: Secondary | ICD-10-CM | POA: Diagnosis not present

## 2018-10-23 DIAGNOSIS — Z0000 Encounter for general adult medical examination without abnormal findings: Secondary | ICD-10-CM | POA: Diagnosis not present

## 2018-10-23 DIAGNOSIS — R238 Other skin changes: Secondary | ICD-10-CM | POA: Diagnosis not present

## 2018-10-25 ENCOUNTER — Ambulatory Visit (INDEPENDENT_AMBULATORY_CARE_PROVIDER_SITE_OTHER): Payer: BC Managed Care – PPO | Admitting: Physician Assistant

## 2018-10-25 ENCOUNTER — Encounter: Payer: Self-pay | Admitting: Physician Assistant

## 2018-10-25 ENCOUNTER — Other Ambulatory Visit: Payer: Self-pay

## 2018-10-25 DIAGNOSIS — S8265XA Nondisplaced fracture of lateral malleolus of left fibula, initial encounter for closed fracture: Secondary | ICD-10-CM | POA: Diagnosis not present

## 2018-10-25 NOTE — Progress Notes (Signed)
Office Visit Note   Patient: Amanda PerchSusan K [name]           Date of Birth: [birth year]           MRN: [id number] Visit Date: 10/25/2018              Requested by: Georgianne Fickamachandran, Ajith, MD [address] [place] [id number] PCP: Georgianne Fickamachandran, Ajith, MD   Assessment & Plan: Visit Diagnoses:  1. Nondisplaced fracture of lateral malleolus of left fibula, initial encounter for closed fracture     Plan: Is placed in a cam walker boot which she will use whenever she is up ambulating.  She is weightbearing as tolerated in cam walker boot.  Like to see her back in 2 weeks that time obtain 3 views of her left ankle.  Elevation wiggling toes encouraged.  Questions encouraged and answered.  Follow-Up Instructions: Return in about 2 weeks (around 11/08/2018).   Orders:  No orders of the defined types were placed in this encounter.  No orders of the defined types were placed in this encounter.     Procedures: No procedures performed   Clinical Data: No additional findings.   Subjective: Chief Complaint  Patient presents with  . Left Ankle - Injury, Pain    Fell on 10/09/2018, while carrying a t.v. down steps.    HPI Amanda [name] is a 68 year old female we are seen for the first time.  She comes in today due to left ankle fracture involving her distal fibula.  She brings radiographs on a CD from Dr.Ramachandran's office.  These are reviewed personally and show a nondisplaced distal shaft fracture involving the fibula.  Otherwise the talus well located within the mortise without diastases.  No fracture involving the medial malleolus.  No other fractures identified throughout the ankle and hindfoot. Patient reports that 2 weeks ago that she fell down some steps while carrying an old TV injuring her ankle.  She has been icing elevating and wrapping the ankle with an Ace bandage.  She had no other injuries.  Review of Systems Please see HPI otherwise negative or noncontributory.   Objective: Vital Signs: There were no vitals taken for this visit.  Physical Exam Constitutional:      Appearance: She is not ill-appearing or diaphoretic.  Pulmonary:     Effort: Pulmonary effort is normal.  Neurological:     Mental Status: She is alert.     Ortho Exam Left ankle she has tenderness over the distal one fourth of the fibular shaft.  No tenderness over the medial malleolus.  No tenderness over the proximal tib-fib.  There is no gross deformity of the ankle.  Sensation intact throughout the foot.  Dorsal pedal pulse 2+.  No tenderness over the posterior tibial tendons peroneal tendons or the Achilles tendon.  Calf supple and nontender. Specialty Comments:  No specialty comments available.  Imaging: No results found.   PMFS History: Patient Active Problem List   Diagnosis Date Noted  . Essential hypertension 01/02/2017  . Hyperlipidemia 12/18/2016  . Chest pain 12/18/2016  . Lightheadedness 12/18/2016  . Bradycardia 12/18/2016  . Alcohol dependence (HCC) 06/13/2012  . Alcohol withdrawal (HCC) 06/13/2012  . Major depression 06/13/2012  . Generalized anxiety disorder 06/13/2012   Past Medical History:  Diagnosis Date  . Anxiety   . Arthritis   . Bradycardia 12/18/2016  . Chest pain 12/18/2016   patient denies  . Depression   . Dyspnea   . Ear  infection    history  . Ectopic pregnancy   . Essential hypertension 01/02/2017   new diagnosiss taking lisinopril  . History of blood transfusion 1981   etopic pregnancy   . Hyperlipidemia 12/18/2016  . Hypertension   . Lightheadedness 12/18/2016  . Umbilical hernia     Family History  Problem Relation Age of Onset  . Hyperlipidemia Mother   . Crohn's disease Mother   . Dementia Mother   . Hyperlipidemia Father   . CAD Father   . Hyperlipidemia Sister   . Hypertension Sister   . Hypertension Brother   . Hyperlipidemia Brother   . Hyperlipidemia Daughter   . CVA Maternal Grandmother   . CVA Maternal  Grandfather   . Stomach cancer Paternal Grandmother     Past Surgical History:  Procedure Laterality Date  . ABDOMINAL HYSTERECTOMY    . AUGMENTATION MAMMAPLASTY Bilateral   . BREAST SURGERY    . CHOLECYSTECTOMY  2002  . colonscopy  11/2002  . DILATION AND CURETTAGE OF UTERUS  09/2007   hysteroscopy for fibroid  . HEMORRHOID SURGERY  08/2004  . LAPAROSCOPIC ASSISTED VAGINAL HYSTERECTOMY  12/07/2011   Procedure: LAPAROSCOPIC ASSISTED VAGINAL HYSTERECTOMY;  Surgeon: Thurnell Lose, MD;  Location: [facility];  Service: Gynecology;  Laterality: N/A;  . RADIOACTIVE SEED GUIDED EXCISIONAL BREAST BIOPSY Right 01/11/2017   Procedure: RADIOACTIVE SEED GUIDED EXCISIONAL BREAST BIOPSY;  Surgeon: Rolm Bookbinder, MD;  Location: [facility];  Service: General;  Laterality: Right;  . UPPER GASTROINTESTINAL ENDOSCOPY  11/2002   Social History   Occupational History  . Not on file  Tobacco Use  . Smoking status: Former Research scientist (life sciences)  . Smokeless tobacco: Never Used  . Tobacco comment: "smoked for 10 years but stopped a long time ago"  Substance and Sexual Activity  . Alcohol use: Yes    Alcohol/week: 4.0 standard drinks    Types: 4 Glasses of wine per week  . Drug use: No  . Sexual activity: Yes

## 2018-10-29 ENCOUNTER — Other Ambulatory Visit: Payer: Self-pay | Admitting: Internal Medicine

## 2018-10-29 DIAGNOSIS — Z0000 Encounter for general adult medical examination without abnormal findings: Secondary | ICD-10-CM | POA: Diagnosis not present

## 2018-10-29 DIAGNOSIS — L129 Pemphigoid, unspecified: Secondary | ICD-10-CM | POA: Diagnosis not present

## 2018-10-29 DIAGNOSIS — E782 Mixed hyperlipidemia: Secondary | ICD-10-CM | POA: Diagnosis not present

## 2018-10-29 DIAGNOSIS — Z1231 Encounter for screening mammogram for malignant neoplasm of breast: Secondary | ICD-10-CM

## 2018-10-29 DIAGNOSIS — M8580 Other specified disorders of bone density and structure, unspecified site: Secondary | ICD-10-CM | POA: Diagnosis not present

## 2018-10-29 DIAGNOSIS — N6320 Unspecified lump in the left breast, unspecified quadrant: Secondary | ICD-10-CM

## 2018-10-29 DIAGNOSIS — S82832D Other fracture of upper and lower end of left fibula, subsequent encounter for closed fracture with routine healing: Secondary | ICD-10-CM | POA: Diagnosis not present

## 2018-10-29 DIAGNOSIS — Z7189 Other specified counseling: Secondary | ICD-10-CM | POA: Diagnosis not present

## 2018-10-29 DIAGNOSIS — Z23 Encounter for immunization: Secondary | ICD-10-CM | POA: Diagnosis not present

## 2018-10-31 ENCOUNTER — Other Ambulatory Visit: Payer: Self-pay

## 2018-10-31 ENCOUNTER — Other Ambulatory Visit: Payer: Self-pay | Admitting: Internal Medicine

## 2018-10-31 DIAGNOSIS — N6320 Unspecified lump in the left breast, unspecified quadrant: Secondary | ICD-10-CM

## 2018-11-05 DIAGNOSIS — Z1212 Encounter for screening for malignant neoplasm of rectum: Secondary | ICD-10-CM | POA: Diagnosis not present

## 2018-11-05 DIAGNOSIS — Z1211 Encounter for screening for malignant neoplasm of colon: Secondary | ICD-10-CM | POA: Diagnosis not present

## 2018-11-07 ENCOUNTER — Encounter: Payer: Self-pay | Admitting: Physician Assistant

## 2018-11-07 ENCOUNTER — Ambulatory Visit (INDEPENDENT_AMBULATORY_CARE_PROVIDER_SITE_OTHER): Payer: BC Managed Care – PPO

## 2018-11-07 ENCOUNTER — Ambulatory Visit (INDEPENDENT_AMBULATORY_CARE_PROVIDER_SITE_OTHER): Payer: Medicare Other | Admitting: Physician Assistant

## 2018-11-07 DIAGNOSIS — M25572 Pain in left ankle and joints of left foot: Secondary | ICD-10-CM

## 2018-11-07 DIAGNOSIS — S8265XA Nondisplaced fracture of lateral malleolus of left fibula, initial encounter for closed fracture: Secondary | ICD-10-CM

## 2018-11-07 NOTE — Progress Notes (Signed)
HPI: Mrs. Granlund returns today for follow-up of her left fibular shaft nondisplaced fracture.  She is ambulating pain markedly.  She is able to put some weight on it.  Overall doing well.  She had no shortness of breath or calf pain.  She feels she is trending towards improvement.  Review of systems see HPI otherwise negative  Physical exam: Left lower leg calf supple nontender.  Slight tenderness over the distal left fibular shaft.  Nontender over the medial malleolus.  There is no significant swelling or ecchymosis of the left ankle. Right great toe she has pain with dorsiflexion of the great toe and tenderness along the MP joint consistent with hallux rigidus.  No erythema or ecchymosis about the right great toe.  Radiographs: 3 views show the distal shaft fracture to remain nondisplaced.  There is slight interval healing.  No other fractures identified talus well located within the ankle mortise no diastases.  Impression: Approximately 4 weeks status post nondisplaced distal fibular shaft fracture  Plan: Discussed with her the hallux rigidus of the right great toe recommend stiff bottom shoe.  Also recommended she pick up some Voltaren gel which she can buy at her local pharmacy.  In regards to the left ankle she is weightbearing as tolerated in the cam walker boot for the next week.  Then she is to transition to an ASO brace over the next week as tolerated on the left ankle.  She will follow-up with [place] in 1 month check her progress no x-rays at that time unless clinically indicated.

## 2018-11-22 ENCOUNTER — Other Ambulatory Visit: Payer: Medicare Other

## 2018-11-26 ENCOUNTER — Other Ambulatory Visit: Payer: Medicare Other

## 2018-12-05 ENCOUNTER — Encounter: Payer: Self-pay | Admitting: Physician Assistant

## 2018-12-05 ENCOUNTER — Other Ambulatory Visit: Payer: Self-pay

## 2018-12-05 ENCOUNTER — Ambulatory Visit (INDEPENDENT_AMBULATORY_CARE_PROVIDER_SITE_OTHER): Payer: Medicare Other | Admitting: Physician Assistant

## 2018-12-05 DIAGNOSIS — Z20822 Contact with and (suspected) exposure to covid-19: Secondary | ICD-10-CM

## 2018-12-05 DIAGNOSIS — S8265XA Nondisplaced fracture of lateral malleolus of left fibula, initial encounter for closed fracture: Secondary | ICD-10-CM

## 2018-12-05 NOTE — Progress Notes (Signed)
Patient was not seen today. Arrived at our office today without a mask ann riding in an automobile with her husband who has tested positive for [place] . She was tested by her PCP today for COVID 19 results pending. Patient has been asymptomatic.  She was told that she could follow-up with [place] once her COVID test was negative.  She stated that she would be seen today or would not be returning per nursing staff.

## 2018-12-06 LAB — NOVEL CORONAVIRUS, NAA: SARS-CoV-2, NAA: NOT DETECTED

## 2018-12-17 ENCOUNTER — Other Ambulatory Visit: Payer: Self-pay

## 2018-12-17 ENCOUNTER — Other Ambulatory Visit: Payer: Self-pay | Admitting: Internal Medicine

## 2018-12-17 ENCOUNTER — Ambulatory Visit
Admission: RE | Admit: 2018-12-17 | Discharge: 2018-12-17 | Disposition: A | Discharge status: Home / Self Care | Payer: Medicare Other | Source: Ambulatory Visit | Attending: Internal Medicine | Admitting: Internal Medicine

## 2018-12-17 DIAGNOSIS — N6320 Unspecified lump in the left breast, unspecified quadrant: Secondary | ICD-10-CM

## 2019-04-01 DIAGNOSIS — K062 Gingival and edentulous alveolar ridge lesions associated with trauma: Secondary | ICD-10-CM | POA: Diagnosis not present

## 2019-04-22 ENCOUNTER — Ambulatory Visit: Payer: Medicare Other | Attending: Internal Medicine

## 2019-04-22 DIAGNOSIS — Z20822 Contact with and (suspected) exposure to covid-19: Secondary | ICD-10-CM

## 2019-04-23 LAB — NOVEL CORONAVIRUS, NAA: SARS-CoV-2, NAA: NOT DETECTED

## 2019-07-10 DIAGNOSIS — E782 Mixed hyperlipidemia: Secondary | ICD-10-CM | POA: Diagnosis not present

## 2019-07-16 ENCOUNTER — Other Ambulatory Visit: Payer: Self-pay | Admitting: Cardiovascular Disease

## 2019-07-24 DIAGNOSIS — M8580 Other specified disorders of bone density and structure, unspecified site: Secondary | ICD-10-CM | POA: Diagnosis not present

## 2019-07-24 DIAGNOSIS — E782 Mixed hyperlipidemia: Secondary | ICD-10-CM | POA: Diagnosis not present

## 2019-07-24 DIAGNOSIS — I10 Essential (primary) hypertension: Secondary | ICD-10-CM | POA: Diagnosis not present

## 2019-09-23 IMAGING — MG MM  DIGITAL DIAGNOSTIC BREAST BILAT IMPLANT W/ TOMO W/ CAD
8 of 14 series · 8 of 34 positions shown · non-contrast
Comparison: 01/11/2017 and earlier

CLINICAL DATA: Palpable abnormality in the LEFT breast first noted
1 month ago. History of bilateral silicone implants, exchanged for
saline implants in 2119.

EXAM:
DIGITAL DIAGNOSTIC BILATERAL MAMMOGRAM WITH IMPLANTS, CAD AND TOMO
ULTRASOUND LEFT BREAST
The patient has retroglandular implants. Standard and implant
displaced views were performed.

[L MLO]
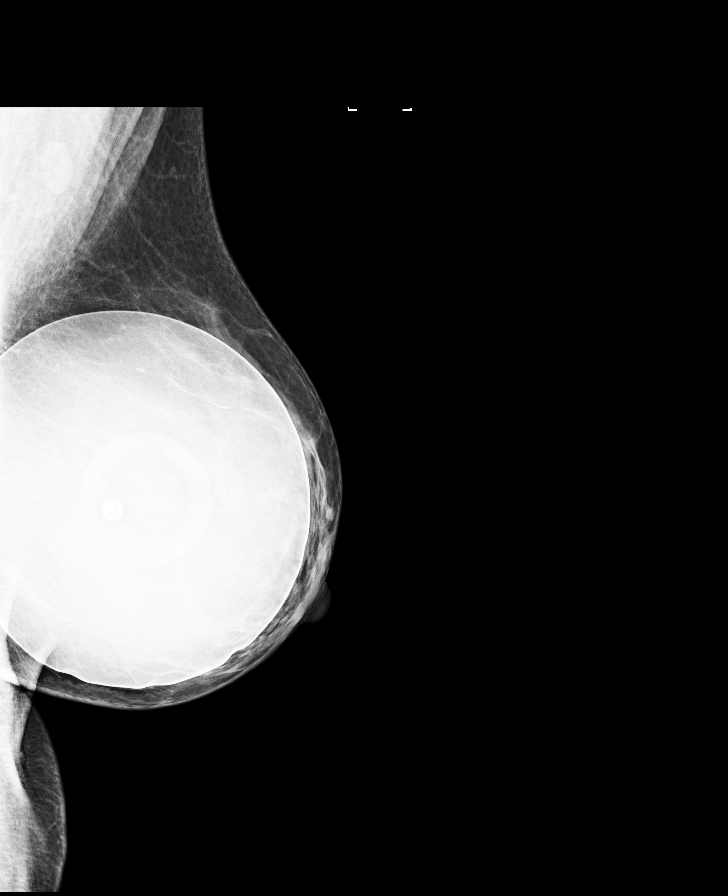

[L CC]
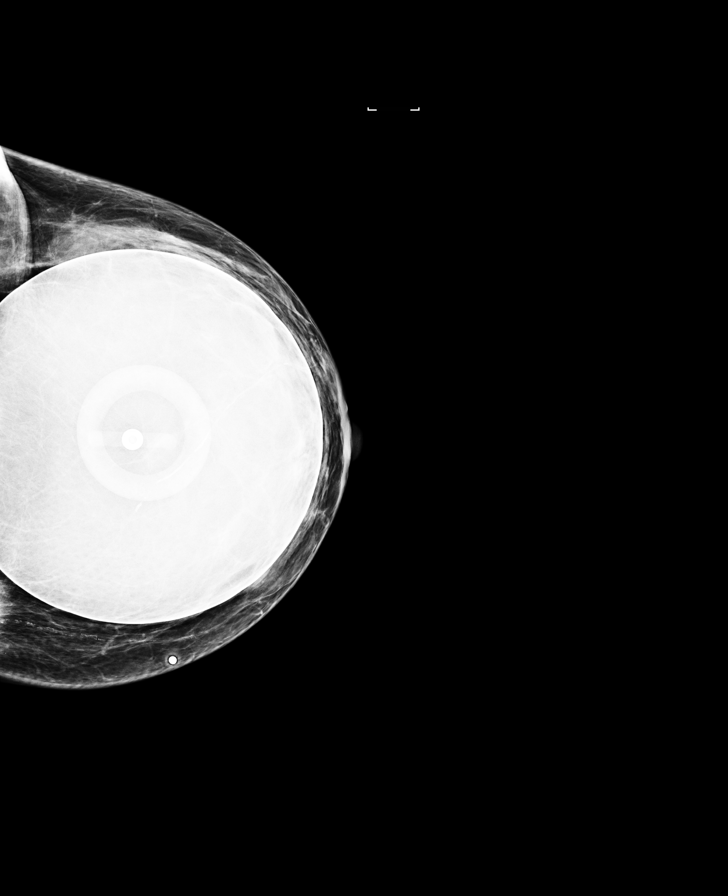

[R CC]
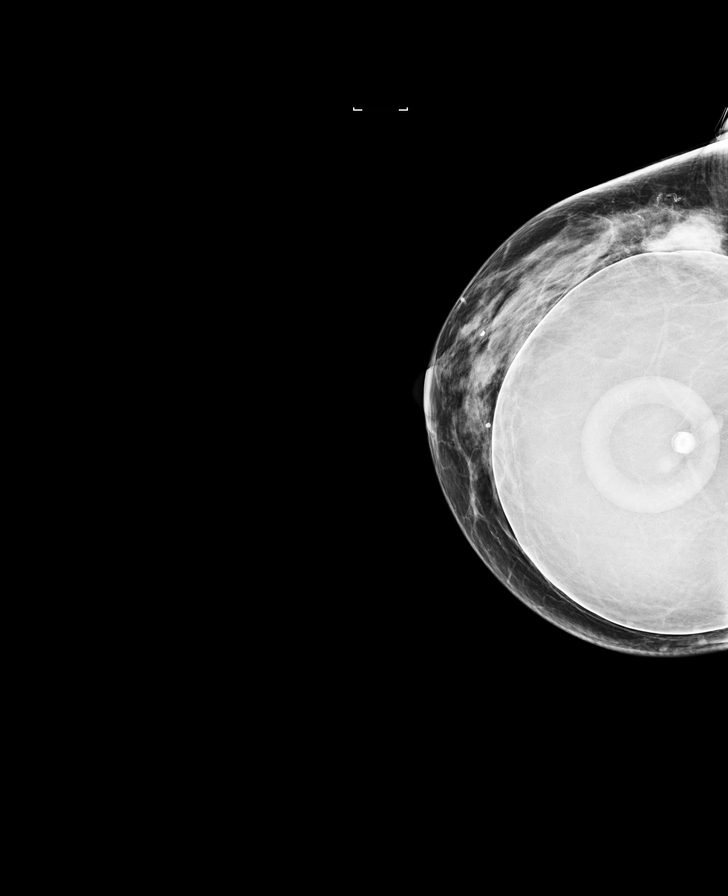

[R MLO]
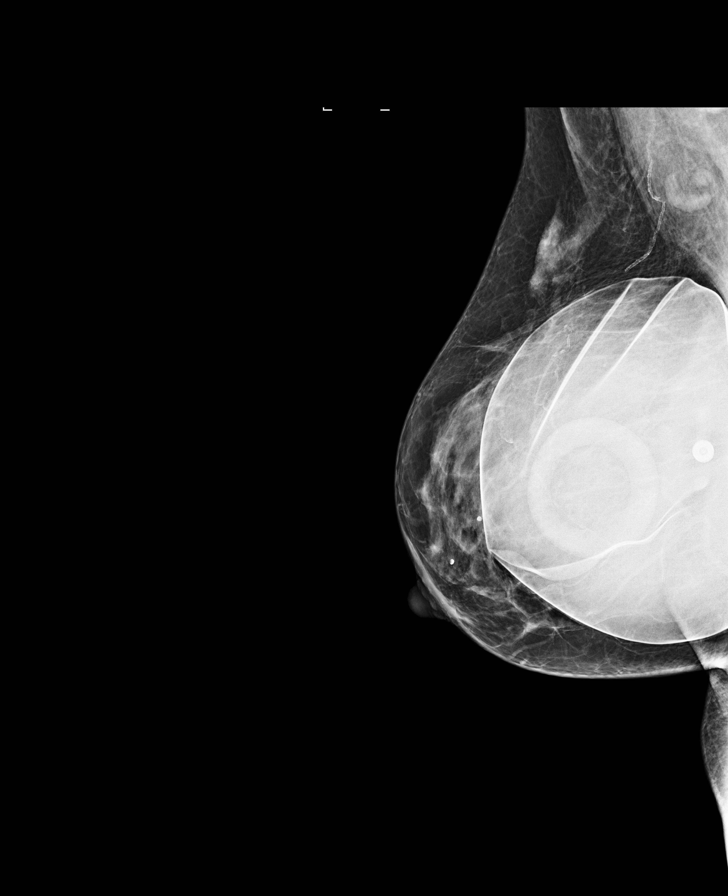

[L CC synth-2D]
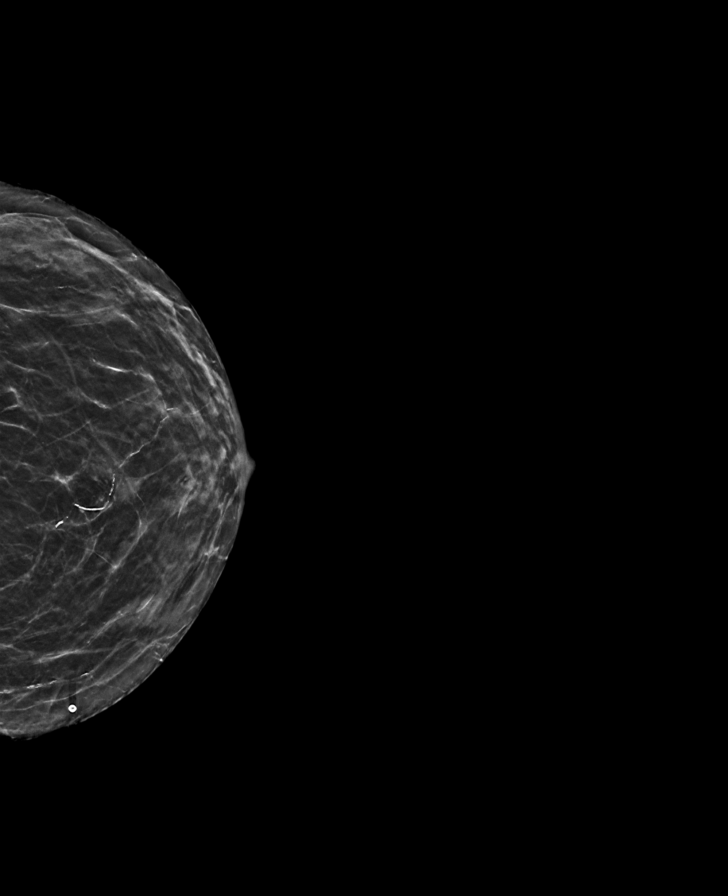

[L TAN synth-2D]
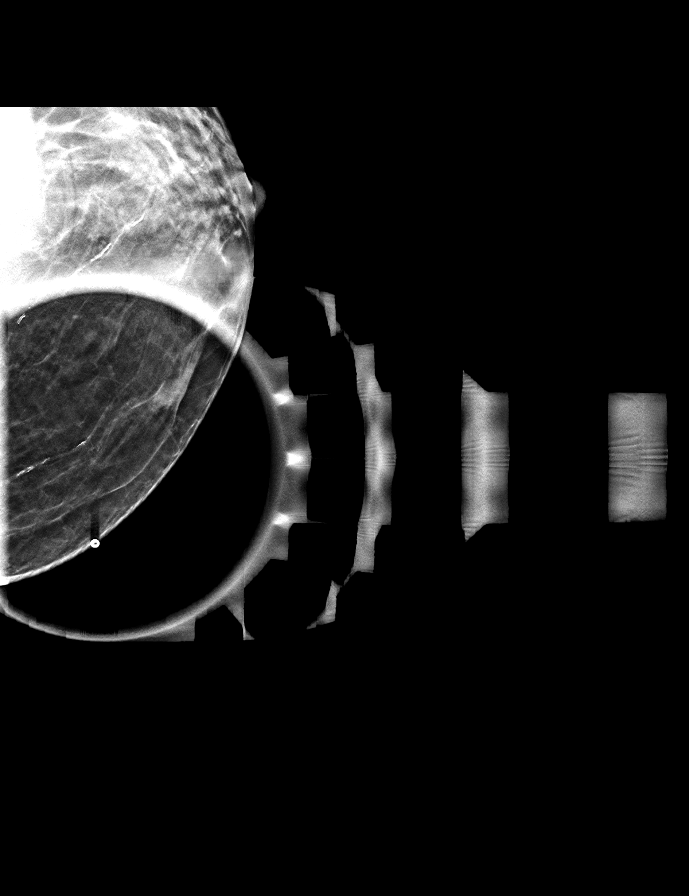

[R MLO synth-2D]
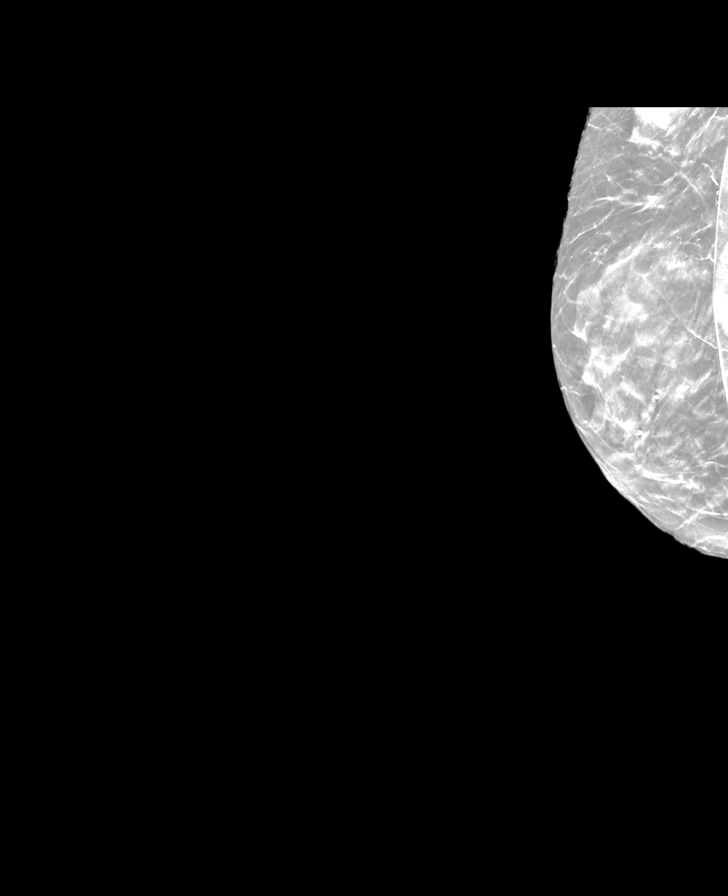

[L MLO synth-2D]
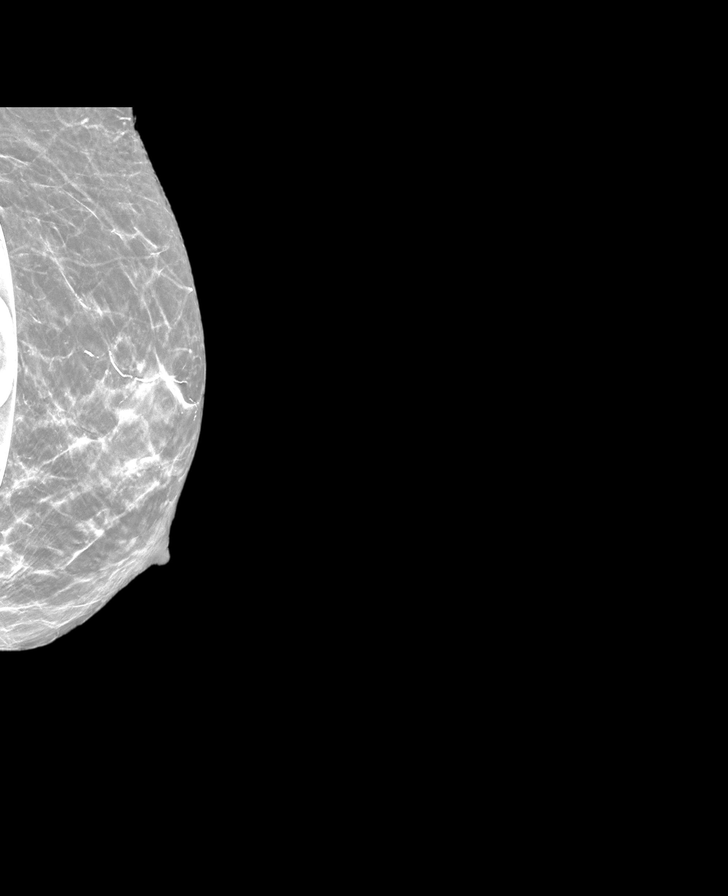

[8 of 34 positions shown; findings below may reference images not displayed]

ACR Breast Density Category c: The breast tissue is heterogeneously
dense, which may obscure small masses.
FINDINGS: No suspicious mass, distortion, or microcalcifications are
identified to suggest presence of malignancy. Spot tangential view
is performed in the MEDIAL aspect of the LEFT breast, in the area
concern to the patient. This view demonstrates normal appearing
fibrofatty tissue without mass.

On physical exam, I palpate no discrete mass in the MEDIAL aspect of
the LEFT breast.

Targeted ultrasound is performed, showing normal appearing
fibroglandular tissue superficial to the implant in the MEDIAL
aspect of the LEFT breast. No suspicious mass, distortion, or
acoustic shadowing is demonstrated with ultrasound.

Mammographic images were processed with CAD.
IMPRESSION: No mammographic or ultrasound evidence for malignancy.

The patient raises the question the need to have her implants
removed/exchanged.

RECOMMENDATION:
Screening mammogram in one year.(Code:[id number])

Recommend consultation with a plastic surgeon to discuss possible
implant explantation.

I have discussed the findings and recommendations with the patient.
If applicable, a reminder letter will be sent to the patient
regarding the next appointment.

BI-RADS CATEGORY  1: Negative.

## 2019-11-22 DIAGNOSIS — H2512 Age-related nuclear cataract, left eye: Secondary | ICD-10-CM | POA: Diagnosis not present

## 2019-11-22 DIAGNOSIS — Z961 Presence of intraocular lens: Secondary | ICD-10-CM | POA: Diagnosis not present

## 2019-11-22 DIAGNOSIS — H43812 Vitreous degeneration, left eye: Secondary | ICD-10-CM | POA: Diagnosis not present

## 2019-12-23 DIAGNOSIS — S0993XA Unspecified injury of face, initial encounter: Secondary | ICD-10-CM | POA: Diagnosis not present

## 2019-12-26 ENCOUNTER — Other Ambulatory Visit: Payer: Self-pay | Admitting: Internal Medicine

## 2019-12-26 DIAGNOSIS — Z1231 Encounter for screening mammogram for malignant neoplasm of breast: Secondary | ICD-10-CM

## 2020-01-10 ENCOUNTER — Ambulatory Visit
Admission: RE | Admit: 2020-01-10 | Discharge: 2020-01-10 | Disposition: A | Discharge status: Home / Self Care | Payer: Medicare Other | Source: Ambulatory Visit | Attending: Internal Medicine | Admitting: Internal Medicine

## 2020-01-10 ENCOUNTER — Other Ambulatory Visit: Payer: Self-pay

## 2020-01-10 DIAGNOSIS — Z1231 Encounter for screening mammogram for malignant neoplasm of breast: Secondary | ICD-10-CM

## 2020-04-09 DIAGNOSIS — I10 Essential (primary) hypertension: Secondary | ICD-10-CM | POA: Diagnosis not present

## 2020-04-09 DIAGNOSIS — M8580 Other specified disorders of bone density and structure, unspecified site: Secondary | ICD-10-CM | POA: Diagnosis not present

## 2020-04-09 DIAGNOSIS — E782 Mixed hyperlipidemia: Secondary | ICD-10-CM | POA: Diagnosis not present

## 2020-04-16 DIAGNOSIS — I10 Essential (primary) hypertension: Secondary | ICD-10-CM | POA: Diagnosis not present

## 2020-04-16 DIAGNOSIS — M8580 Other specified disorders of bone density and structure, unspecified site: Secondary | ICD-10-CM | POA: Diagnosis not present

## 2020-04-16 DIAGNOSIS — Z23 Encounter for immunization: Secondary | ICD-10-CM | POA: Diagnosis not present

## 2020-04-16 DIAGNOSIS — Z0000 Encounter for general adult medical examination without abnormal findings: Secondary | ICD-10-CM | POA: Diagnosis not present

## 2020-04-16 DIAGNOSIS — E782 Mixed hyperlipidemia: Secondary | ICD-10-CM | POA: Diagnosis not present

## 2020-04-16 DIAGNOSIS — L129 Pemphigoid, unspecified: Secondary | ICD-10-CM | POA: Diagnosis not present

## 2020-07-06 DIAGNOSIS — H52202 Unspecified astigmatism, left eye: Secondary | ICD-10-CM | POA: Diagnosis not present

## 2020-07-06 DIAGNOSIS — Z961 Presence of intraocular lens: Secondary | ICD-10-CM | POA: Diagnosis not present

## 2020-07-06 DIAGNOSIS — H524 Presbyopia: Secondary | ICD-10-CM | POA: Diagnosis not present

## 2020-07-06 DIAGNOSIS — H25812 Combined forms of age-related cataract, left eye: Secondary | ICD-10-CM | POA: Diagnosis not present

## 2020-09-23 DIAGNOSIS — R0781 Pleurodynia: Secondary | ICD-10-CM | POA: Diagnosis not present

## 2020-09-23 DIAGNOSIS — W108XXA Fall (on) (from) other stairs and steps, initial encounter: Secondary | ICD-10-CM | POA: Diagnosis not present

## 2020-09-23 DIAGNOSIS — S2242XA Multiple fractures of ribs, left side, initial encounter for closed fracture: Secondary | ICD-10-CM | POA: Diagnosis not present

## 2020-09-24 DIAGNOSIS — H25812 Combined forms of age-related cataract, left eye: Secondary | ICD-10-CM | POA: Diagnosis not present

## 2020-09-30 DIAGNOSIS — M542 Cervicalgia: Secondary | ICD-10-CM | POA: Diagnosis not present

## 2020-09-30 DIAGNOSIS — S52502A Unspecified fracture of the lower end of left radius, initial encounter for closed fracture: Secondary | ICD-10-CM | POA: Diagnosis not present

## 2020-09-30 DIAGNOSIS — M79641 Pain in right hand: Secondary | ICD-10-CM | POA: Diagnosis not present

## 2020-09-30 DIAGNOSIS — M79632 Pain in left forearm: Secondary | ICD-10-CM | POA: Diagnosis not present

## 2020-09-30 DIAGNOSIS — M25512 Pain in left shoulder: Secondary | ICD-10-CM | POA: Diagnosis not present

## 2020-09-30 DIAGNOSIS — M79644 Pain in right finger(s): Secondary | ICD-10-CM | POA: Diagnosis not present

## 2020-10-01 ENCOUNTER — Other Ambulatory Visit: Payer: Self-pay

## 2020-10-01 ENCOUNTER — Ambulatory Visit (HOSPITAL_COMMUNITY): Payer: Medicare Other | Admitting: Anesthesiology

## 2020-10-01 ENCOUNTER — Ambulatory Visit (HOSPITAL_COMMUNITY): Payer: Medicare Other

## 2020-10-01 ENCOUNTER — Encounter (HOSPITAL_COMMUNITY)
Admission: RE | Disposition: A | Discharge status: Home / Self Care | Payer: Self-pay | Source: Ambulatory Visit | Attending: Orthopedic Surgery

## 2020-10-01 ENCOUNTER — Ambulatory Visit (HOSPITAL_COMMUNITY)
Admission: RE | Admit: 2020-10-01 | Discharge: 2020-10-01 | Disposition: A | Discharge status: Home / Self Care | Payer: Medicare Other | Source: Ambulatory Visit | Attending: Orthopedic Surgery | Admitting: Orthopedic Surgery

## 2020-10-01 ENCOUNTER — Encounter (HOSPITAL_COMMUNITY): Payer: Self-pay | Admitting: Orthopedic Surgery

## 2020-10-01 DIAGNOSIS — Z888 Allergy status to other drugs, medicaments and biological substances status: Secondary | ICD-10-CM | POA: Insufficient documentation

## 2020-10-01 DIAGNOSIS — S6412XA Injury of median nerve at wrist and hand level of left arm, initial encounter: Secondary | ICD-10-CM | POA: Diagnosis not present

## 2020-10-01 DIAGNOSIS — Z7982 Long term (current) use of aspirin: Secondary | ICD-10-CM | POA: Insufficient documentation

## 2020-10-01 DIAGNOSIS — X58XXXA Exposure to other specified factors, initial encounter: Secondary | ICD-10-CM | POA: Insufficient documentation

## 2020-10-01 DIAGNOSIS — S5332XA Traumatic rupture of left ulnar collateral ligament, initial encounter: Secondary | ICD-10-CM | POA: Diagnosis not present

## 2020-10-01 DIAGNOSIS — S52572A Other intraarticular fracture of lower end of left radius, initial encounter for closed fracture: Secondary | ICD-10-CM | POA: Diagnosis not present

## 2020-10-01 DIAGNOSIS — S52502A Unspecified fracture of the lower end of left radius, initial encounter for closed fracture: Secondary | ICD-10-CM | POA: Diagnosis not present

## 2020-10-01 DIAGNOSIS — Z79899 Other long term (current) drug therapy: Secondary | ICD-10-CM | POA: Insufficient documentation

## 2020-10-01 DIAGNOSIS — I10 Essential (primary) hypertension: Secondary | ICD-10-CM | POA: Diagnosis not present

## 2020-10-01 DIAGNOSIS — Z87891 Personal history of nicotine dependence: Secondary | ICD-10-CM | POA: Diagnosis not present

## 2020-10-01 DIAGNOSIS — G5602 Carpal tunnel syndrome, left upper limb: Secondary | ICD-10-CM | POA: Insufficient documentation

## 2020-10-01 DIAGNOSIS — G8918 Other acute postprocedural pain: Secondary | ICD-10-CM | POA: Diagnosis not present

## 2020-10-01 DIAGNOSIS — Z885 Allergy status to narcotic agent status: Secondary | ICD-10-CM | POA: Diagnosis not present

## 2020-10-01 DIAGNOSIS — S60011A Contusion of right thumb without damage to nail, initial encounter: Secondary | ICD-10-CM | POA: Insufficient documentation

## 2020-10-01 HISTORY — PX: OPEN REDUCTION INTERNAL FIXATION (ORIF) DISTAL RADIAL FRACTURE: SHX5989

## 2020-10-01 LAB — BASIC METABOLIC PANEL
Anion gap: 10 (ref 5–15)
BUN: 7 mg/dL — ABNORMAL LOW (ref 8–23)
CO2: 24 mmol/L (ref 22–32)
Calcium: 8.8 mg/dL — ABNORMAL LOW (ref 8.9–10.3)
Chloride: 100 mmol/L (ref 98–111)
Creatinine, Ser: 0.53 mg/dL (ref 0.44–1.00)
GFR, Estimated: >60 mL/min (ref >60)
Glucose, Bld: 79 mg/dL (ref 70–99)
Potassium: 3.8 mmol/L (ref 3.5–5.1)
Sodium: 134 mmol/L — ABNORMAL LOW (ref 135–145)

## 2020-10-01 SURGERY — OPEN REDUCTION INTERNAL FIXATION (ORIF) DISTAL RADIUS FRACTURE
Anesthesia: General | Site: Arm Lower | Laterality: Left

## 2020-10-01 MED ORDER — KETOROLAC TROMETHAMINE 15 MG/ML IJ SOLN
INTRAMUSCULAR | Status: AC
  Filled 2020-10-01: qty 1

## 2020-10-01 MED ORDER — LACTATED RINGERS IV SOLN: INTRAVENOUS | Status: DC

## 2020-10-01 MED ORDER — ACETAMINOPHEN 10 MG/ML IV SOLN
1000.0000 mg | Freq: Once | INTRAVENOUS | Status: DC | PRN
  Administered 2020-10-01: 1000 mg via INTRAVENOUS

## 2020-10-01 MED ORDER — 0.9 % SODIUM CHLORIDE (POUR BTL) OPTIME
TOPICAL | Status: DC | PRN
  Administered 2020-10-01: 1000 mL

## 2020-10-01 MED ORDER — CEFAZOLIN SODIUM-DEXTROSE 2-4 GM/100ML-% IV SOLN
INTRAVENOUS | Status: AC
  Filled 2020-10-01: qty 100

## 2020-10-01 MED ORDER — DEXAMETHASONE SODIUM PHOSPHATE 10 MG/ML IJ SOLN
INTRAMUSCULAR | Status: AC
  Filled 2020-10-01: qty 1

## 2020-10-01 MED ORDER — LIDOCAINE HCL (CARDIAC) PF 100 MG/5ML IV SOSY
PREFILLED_SYRINGE | INTRAVENOUS | Status: DC | PRN
  Administered 2020-10-01: 60 mg via INTRATRACHEAL

## 2020-10-01 MED ORDER — CEFAZOLIN SODIUM-DEXTROSE 2-3 GM-%(50ML) IV SOLR
INTRAVENOUS | Status: DC | PRN
  Administered 2020-10-01: 2 g via INTRAVENOUS

## 2020-10-01 MED ORDER — ACETAMINOPHEN 10 MG/ML IV SOLN
INTRAVENOUS | Status: AC
  Filled 2020-10-01: qty 100

## 2020-10-01 MED ORDER — DEXAMETHASONE SODIUM PHOSPHATE 4 MG/ML IJ SOLN
INTRAMUSCULAR | Status: DC | PRN
  Administered 2020-10-01: 5 mg via INTRAVENOUS

## 2020-10-01 MED ORDER — ONDANSETRON HCL 4 MG/2ML IJ SOLN
INTRAMUSCULAR | Status: AC
  Filled 2020-10-01: qty 2

## 2020-10-01 MED ORDER — FENTANYL CITRATE (PF) 100 MCG/2ML IJ SOLN
INTRAMUSCULAR | Status: DC | PRN
  Administered 2020-10-01 (×5): 50 ug via INTRAVENOUS

## 2020-10-01 MED ORDER — FENTANYL CITRATE (PF) 100 MCG/2ML IJ SOLN
25.0000 ug | INTRAMUSCULAR | Status: DC | PRN
  Administered 2020-10-01 (×2): 50 ug via INTRAVENOUS

## 2020-10-01 MED ORDER — MIDAZOLAM HCL 2 MG/2ML IJ SOLN
INTRAMUSCULAR | Status: AC
  Filled 2020-10-01: qty 2

## 2020-10-01 MED ORDER — FENTANYL CITRATE (PF) 100 MCG/2ML IJ SOLN
INTRAMUSCULAR | Status: AC
  Filled 2020-10-01: qty 2

## 2020-10-01 MED ORDER — AMISULPRIDE (ANTIEMETIC) 5 MG/2ML IV SOLN: 10.0000 mg | Freq: Once | INTRAVENOUS | Status: DC | PRN

## 2020-10-01 MED ORDER — ONDANSETRON HCL 4 MG/2ML IJ SOLN: 4.0000 mg | Freq: Once | INTRAMUSCULAR | Status: DC | PRN

## 2020-10-01 MED ORDER — MIDAZOLAM HCL 5 MG/5ML IJ SOLN
INTRAMUSCULAR | Status: DC | PRN
  Administered 2020-10-01: 2 mg via INTRAVENOUS

## 2020-10-01 MED ORDER — FENTANYL CITRATE (PF) 250 MCG/5ML IJ SOLN
INTRAMUSCULAR | Status: AC
  Filled 2020-10-01: qty 5

## 2020-10-01 MED ORDER — ONDANSETRON HCL 4 MG/2ML IJ SOLN
INTRAMUSCULAR | Status: DC | PRN
  Administered 2020-10-01: 4 mg via INTRAVENOUS

## 2020-10-01 MED ORDER — KETOROLAC TROMETHAMINE 15 MG/ML IJ SOLN
15.0000 mg | Freq: Once | INTRAMUSCULAR | Status: AC | PRN
  Administered 2020-10-01: 15 mg via INTRAVENOUS

## 2020-10-01 MED ORDER — DEXMEDETOMIDINE (PRECEDEX) IN NS 20 MCG/5ML (4 MCG/ML) IV SYRINGE
PREFILLED_SYRINGE | INTRAVENOUS | Status: AC
  Filled 2020-10-01: qty 5

## 2020-10-01 MED ORDER — LIDOCAINE 2% (20 MG/ML) 5 ML SYRINGE
INTRAMUSCULAR | Status: AC
  Filled 2020-10-01: qty 5

## 2020-10-01 MED ORDER — BUPIVACAINE HCL (PF) 0.25 % IJ SOLN
INTRAMUSCULAR | Status: DC | PRN
  Administered 2020-10-01: 17 mL

## 2020-10-01 MED ORDER — BUPIVACAINE-EPINEPHRINE (PF) 0.5% -1:200000 IJ SOLN
INTRAMUSCULAR | Status: DC | PRN
  Administered 2020-10-01: 30 mL via PERINEURAL

## 2020-10-01 MED ORDER — PROPOFOL 10 MG/ML IV BOLUS
INTRAVENOUS | Status: DC | PRN
  Administered 2020-10-01: 200 mg via INTRAVENOUS

## 2020-10-01 MED ORDER — CHLORHEXIDINE GLUCONATE 0.12 % MT SOLN
15.0000 mL | OROMUCOSAL | Status: AC
  Administered 2020-10-01: 15 mL via OROMUCOSAL
  Filled 2020-10-01 (×2): qty 15

## 2020-10-01 MED ORDER — BUPIVACAINE HCL (PF) 0.25 % IJ SOLN
INTRAMUSCULAR | Status: AC
  Filled 2020-10-01: qty 30

## 2020-10-01 MED ORDER — DEXMEDETOMIDINE (PRECEDEX) IN NS 20 MCG/5ML (4 MCG/ML) IV SYRINGE
PREFILLED_SYRINGE | INTRAVENOUS | Status: DC | PRN
Start: 2020-10-01 — End: 2020-10-01
  Administered 2020-10-01: 20 ug via INTRAVENOUS

## 2020-10-01 SURGICAL SUPPLY — 62 items
BAG COUNTER SPONGE SURGICOUNT (BAG) ×2 IMPLANT
BAG SPNG CNTER NS LX DISP (BAG) ×1
BIT DRILL 2.2 SS TIBIAL (BIT) ×1 IMPLANT
BLADE CLIPPER SURG (BLADE) IMPLANT
BNDG CMPR 9X4 STRL LF SNTH (GAUZE/BANDAGES/DRESSINGS) ×1
BNDG ELASTIC 3X5.8 VLCR STR LF (GAUZE/BANDAGES/DRESSINGS) ×5 IMPLANT
BNDG ELASTIC 4X5.8 VLCR STR LF (GAUZE/BANDAGES/DRESSINGS) ×2 IMPLANT
BNDG ESMARK 4X9 LF (GAUZE/BANDAGES/DRESSINGS) ×2 IMPLANT
BNDG GAUZE ELAST 4 BULKY (GAUZE/BANDAGES/DRESSINGS) ×4 IMPLANT
CORD BIPOLAR FORCEPS 12FT (ELECTRODE) ×2 IMPLANT
COVER SURGICAL LIGHT HANDLE (MISCELLANEOUS) ×2 IMPLANT
CUFF TOURN SGL QUICK 18X4 (TOURNIQUET CUFF) ×2 IMPLANT
CUFF TOURN SGL QUICK 24 (TOURNIQUET CUFF)
CUFF TRNQT CYL 24X4X16.5-23 (TOURNIQUET CUFF) IMPLANT
DECANTER SPIKE VIAL GLASS SM (MISCELLANEOUS) IMPLANT
DRAIN TLS ROUND 10FR (DRAIN) IMPLANT
DRAPE OEC MINIVIEW 54X84 (DRAPES) IMPLANT
DRAPE U-SHAPE 47X51 STRL (DRAPES) ×2 IMPLANT
DRIVER BIT SQUARE 1.7/2.2 (TRAUMA) ×2 IMPLANT
DRSG ADAPTIC 3X8 NADH LF (GAUZE/BANDAGES/DRESSINGS) ×2 IMPLANT
DRSG EMULSION OIL 3X3 NADH (GAUZE/BANDAGES/DRESSINGS) ×1 IMPLANT
GAUZE SPONGE 4X4 12PLY STRL (GAUZE/BANDAGES/DRESSINGS) ×2 IMPLANT
GAUZE XEROFORM 5X9 LF (GAUZE/BANDAGES/DRESSINGS) ×2 IMPLANT
GLOVE SURG ENC TEXT LTX SZ8 (GLOVE) ×2 IMPLANT
GLOVE SURG MICRO LTX SZ8 (GLOVE) ×2 IMPLANT
GOWN STRL REUS W/ TWL LRG LVL3 (GOWN DISPOSABLE) ×3 IMPLANT
GOWN STRL REUS W/ TWL XL LVL3 (GOWN DISPOSABLE) ×3 IMPLANT
GOWN STRL REUS W/TWL LRG LVL3 (GOWN DISPOSABLE) ×6
GOWN STRL REUS W/TWL XL LVL3 (GOWN DISPOSABLE) ×6
KIT BASIN OR (CUSTOM PROCEDURE TRAY) ×2 IMPLANT
KIT TURNOVER KIT B (KITS) ×2 IMPLANT
MANIFOLD NEPTUNE II (INSTRUMENTS) ×2 IMPLANT
NEEDLE 22X1 1/2 (OR ONLY) (NEEDLE) IMPLANT
NS IRRIG 1000ML POUR BTL (IV SOLUTION) ×2 IMPLANT
PACK ORTHO EXTREMITY (CUSTOM PROCEDURE TRAY) ×2 IMPLANT
PAD ARMBOARD 7.5X6 YLW CONV (MISCELLANEOUS) ×4 IMPLANT
PAD CAST 4YDX4 CTTN HI CHSV (CAST SUPPLIES) ×3 IMPLANT
PADDING CAST COTTON 4X4 STRL (CAST SUPPLIES) ×2
PEG LOCKING SMOOTH 2.2X14 (Peg) ×1 IMPLANT
PEG LOCKING SMOOTH 2.2X18 (Peg) ×2 IMPLANT
PEG LOCKING SMOOTH 2.2X20 (Screw) ×3 IMPLANT
PEG LOCKING SMOOTH 2.2X22 (Screw) ×1 IMPLANT
PLATE STD DVR LEFT (Plate) ×2 IMPLANT
PLATE STD DVR LT 24X55 (Plate) IMPLANT
SCREW LOCK 14X2.7X 3 LD TPR (Screw) IMPLANT
SCREW LOCKING 2.7X13MM (Screw) ×2 IMPLANT
SCREW LOCKING 2.7X14 (Screw) ×6 IMPLANT
SOL PREP POV-IOD 4OZ 10% (MISCELLANEOUS) ×4 IMPLANT
SPLINT FIBERGLASS 4X30 (CAST SUPPLIES) ×1 IMPLANT
SPONGE T-LAP 4X18 ~~LOC~~+RFID (SPONGE) IMPLANT
SUT MNCRL AB 4-0 PS2 18 (SUTURE) ×2 IMPLANT
SUT PROLENE 3 0 PS 2 (SUTURE) IMPLANT
SUT PROLENE 4 0 PS 2 18 (SUTURE) IMPLANT
SUT VIC AB 3-0 FS2 27 (SUTURE) IMPLANT
SYR CONTROL 10ML LL (SYRINGE) IMPLANT
SYSTEM CHEST DRAIN TLS 7FR (DRAIN) IMPLANT
TOWEL GREEN STERILE (TOWEL DISPOSABLE) ×2 IMPLANT
TOWEL GREEN STERILE FF (TOWEL DISPOSABLE) ×2 IMPLANT
TUBE CONNECTING 12X1/4 (SUCTIONS) ×2 IMPLANT
TUBE EVACUATION TLS (MISCELLANEOUS) ×2 IMPLANT
UNDERPAD 30X36 HEAVY ABSORB (UNDERPADS AND DIAPERS) ×2 IMPLANT
WATER STERILE IRR 1000ML POUR (IV SOLUTION) ×2 IMPLANT

## 2020-10-01 NOTE — Anesthesia Procedure Notes (Signed)
Procedure Name: LMA Insertion Date/Time: 10/01/2020 7:02 PM Performed by: Mayer Camel, CRNA Pre-anesthesia Checklist: Patient identified, Emergency Drugs available, Suction available and Patient being monitored Patient Re-evaluated:Patient Re-evaluated prior to induction Oxygen Delivery Method: Circle System Utilized Preoxygenation: Pre-oxygenation with 100% oxygen Induction Type: IV induction LMA: LMA inserted LMA Size: 4.0 Number of attempts: 1 Airway Equipment and Method: Bite block Placement Confirmation: positive ETCO2 Tube secured with: Tape Dental Injury: Teeth and Oropharynx as per pre-operative assessment

## 2020-10-01 NOTE — Discharge Instructions (Signed)

## 2020-10-01 NOTE — Transfer of Care (Signed)
Immediate Anesthesia Transfer of Care Note  Patient: Amanda [name]  Procedure(s) Performed: Open reduction internal fixation left distal radius fracture with brachioradialis tenotomy.  Left carpal tunnel release.  Right thumb evaluation under anesthesia. (Left: Arm Lower)  Patient Location: PACU  Anesthesia Type:General  Level of Consciousness: awake, alert  and oriented  Airway & Oxygen Therapy: Patient Spontanous Breathing and Patient connected to face mask oxygen  Post-op Assessment: Report given to RN and Post -op Vital signs reviewed and stable  Post vital signs: Reviewed and stable  Last Vitals:  Vitals Value Taken Time  BP 170/92 10/01/20 2020  Temp    Pulse 64 10/01/20 2021  Resp 22 10/01/20 2021  SpO2 100 % 10/01/20 2021  Vitals shown include unvalidated device data.  Last Pain:  Vitals:   10/01/20 1601  TempSrc:   PainSc: 0-No pain      Patients Stated Pain Goal: 0 (10/01/20 1601)  Complications: No notable events documented.

## 2020-10-01 NOTE — Progress Notes (Addendum)
Upon arrival to PACU, patient in extreme pain.  PRN pain meds given include of IV fentanyl, 15mg  IV toradol, and 1000mg  IV ofirmev with minimal relief.  See MAR for administration times in PACU.  Dr. and Dr. notified and will do post op long acting supraclavicular block in PACU.  Will continue to monitor.

## 2020-10-01 NOTE — Anesthesia Postprocedure Evaluation (Signed)
Anesthesia Post Note  Patient: Amanda [name]  Procedure(s) Performed: Open reduction internal fixation left distal radius fracture with brachioradialis tenotomy.  Left carpal tunnel release.  Right thumb evaluation under anesthesia. (Left: Arm Lower)     Patient location during evaluation: PACU Anesthesia Type: General and Regional Level of consciousness: awake Pain management: pain level controlled Vital Signs Assessment: post-procedure vital signs reviewed and stable Respiratory status: spontaneous breathing, nonlabored ventilation, respiratory function stable and patient connected to nasal cannula oxygen Cardiovascular status: blood pressure returned to baseline and stable Postop Assessment: no apparent nausea or vomiting Anesthetic complications: no Comments: Called by RN and notified of patient in pain. Multi-modal analgesia given with minimal relief. Surgeon requesting long acting regional anesthesia in hopes of avoiding hospital admission for pain control. Plan discussed with patient. Post operative block performed. Family updated.    No notable events documented.  Last Vitals:  Vitals:   10/01/20 2100 10/01/20 2115  BP: (!) 158/76 (!) 141/86  Pulse: (!) 56 75  Resp: 10 17  Temp:  36.6 C  SpO2: 100% 98%    Last Pain:  Vitals:   10/01/20 2115  TempSrc:   PainSc: 5                  Sentoria Brent P Samhita Kretsch

## 2020-10-01 NOTE — Anesthesia Procedure Notes (Signed)
Anesthesia Regional Block: Supraclavicular block   Pre-Anesthetic Checklist: , timeout performed,  Correct Patient, Correct Site, Correct Laterality,  Correct Procedure, Correct Position, site marked,  Risks and benefits discussed,  Surgical consent,  Pre-op evaluation,  At surgeon's request and post-op pain management  Laterality: Left  Prep: chloraprep       Needles:  Injection technique: Single-shot  Needle Type: Echogenic Stimulator Needle     Needle Length: 9cm  Needle Gauge: 21     Additional Needles:   Procedures:,,,, ultrasound used (permanent image in chart),,    Narrative:  Start time: 10/01/2020 9:05 PM End time: 10/01/2020 9:15 PM Injection made incrementally with aspirations every 5 mL.  Performed by: Personally  Anesthesiologist: Leonides Grills, MD  Additional Notes: Functioning IV was confirmed and monitors were applied.  A timeout was performed. Sterile prep, hand hygiene and sterile gloves were used. A 84mm 21ga Arrow echogenic stimulator needle was used. Negative aspiration and negative test dose prior to incremental administration of local anesthetic. The patient tolerated the procedure well.  Ultrasound guidance: relevent anatomy identified, needle position confirmed, local anesthetic spread visualized around nerve(s), vascular puncture avoided.  Image printed for medical record.

## 2020-10-01 NOTE — H&P (Signed)
Amanda [name] is an 70 y.o. female.   Chief Complaint: Right thumb MCP pain and a displaced left distal radius fracture with carpal tunnel symptomatology/median nerve contusion HPI: Patient presents for open reduction internal fixation left wrist fracture with left carpal tunnel release and brachial radialis tenotomy.  We also planning evaluation under anesthesia right thumb and repair ulnar collateral ligament as/if necessary.  Patient presents for evaluation and treatment of the of their upper extremity predicament. The patient denies neck, back, chest or  abdominal pain. The patient notes that they have no lower extremity problems. The patients primary complaint is noted. We are planning surgical care pathway for the upper extremity.   Past Medical History:  Diagnosis Date   Anxiety    Arthritis    Bradycardia 12/18/2016   Chest pain 12/18/2016   patient denies   Depression    Dyspnea    Ear infection    history   Ectopic pregnancy    Essential hypertension 01/02/2017   new diagnosiss taking lisinopril   History of blood transfusion 1981   etopic pregnancy    Hyperlipidemia 12/18/2016   Hypertension    Lightheadedness 12/18/2016   Umbilical hernia     Past Surgical History:  Procedure Laterality Date   ABDOMINAL HYSTERECTOMY     AUGMENTATION MAMMAPLASTY Bilateral    BREAST SURGERY     CHOLECYSTECTOMY  2002   colonscopy  11/2002   DILATION AND CURETTAGE OF UTERUS  09/2007   hysteroscopy for fibroid   HEMORRHOID SURGERY  08/2004   LAPAROSCOPIC ASSISTED VAGINAL HYSTERECTOMY  12/07/2011   Procedure: LAPAROSCOPIC ASSISTED VAGINAL HYSTERECTOMY;  Surgeon: Geryl Rankins, MD;  Location: [facility];  Service: Gynecology;  Laterality: N/A;   RADIOACTIVE SEED GUIDED EXCISIONAL BREAST BIOPSY Right 01/11/2017   Procedure: RADIOACTIVE SEED GUIDED EXCISIONAL BREAST BIOPSY;  Surgeon: Emelia Loron, MD;  Location[facility];  Service: General;  Laterality: Right;   UPPER GASTROINTESTINAL ENDOSCOPY   11/2002    Family History  Problem Relation Age of Onset   Hyperlipidemia Mother    Crohn's disease Mother    Dementia Mother    Hyperlipidemia Father    CAD Father    Hyperlipidemia Sister    Hypertension Sister    Hypertension Brother    Hyperlipidemia Brother    Hyperlipidemia Daughter    CVA Maternal Grandmother    CVA Maternal Grandfather    Stomach cancer Paternal Grandmother    Social History:  reports that she has quit smoking. She has never used smokeless tobacco. She reports current alcohol use of about 4.0 standard drinks of alcohol per week. She reports that she does not use drugs.  Allergies:  Allergies  Allergen Reactions   Codeine Nausea Only   Medrol [Methylprednisolone] Rash    Medications Prior to Admission  Medication Sig Dispense Refill   Ascorbic Acid (VITAMIN C) 500 MG CHEW Chew 1 tablet by mouth at bedtime.     aspirin EC 81 MG tablet Take 81 mg by mouth at bedtime.     Calcium Carb-Cholecalciferol (CALCIUM 1000 + D PO) Take 1 tablet by mouth at bedtime.     escitalopram (LEXAPRO) 20 MG tablet Take 20 mg by mouth in the morning.     glucosamine-chondroitin 500-400 MG tablet Take 1 tablet by mouth in the morning.     HYDROcodone-acetaminophen (NORCO/VICODIN) 5-325 MG tablet Take 1 tablet by mouth every 6 (six) hours as needed for moderate pain or severe pain.     lisinopril (ZESTRIL) 10  MG tablet Take 10 mg by mouth in the morning.     Multiple Vitamin (MULTIVITAMIN WITH MINERALS) TABS tablet Take 1 tablet by mouth daily.     Omega-3 Fatty Acids (FISH OIL) 500 MG CAPS Take 1 capsule by mouth at bedtime.     rosuvastatin (CRESTOR) 5 MG tablet TAKE ONE TABLET BY MOUTH ON MONDAYS AND FRIDAYS AS DIRECTED (Patient taking differently: Take 5 mg by mouth every morning.) 24 tablet 0   cephALEXin (KEFLEX) 500 MG capsule Take 500 mg by mouth 4 (four) times daily.     cyclobenzaprine (FLEXERIL) 10 MG tablet Take 10 mg by mouth 3 (three) times daily.     lisinopril  (PRINIVIL,ZESTRIL) 10 MG tablet Take 1 tablet (10 mg total) by mouth daily. PLEASE SCHEDULE OFFICE VISIT 30 tablet 1   methocarbamol (ROBAXIN) 500 MG tablet Take 1,000 mg by mouth 2 (two) times daily.     oxyCODONE (OXY IR/ROXICODONE) 5 MG immediate release tablet Take 5 mg by mouth every 6 (six) hours as needed for moderate pain or severe pain.      Results for orders placed or performed during the hospital encounter of 10/01/20 (from the past 48 hour(s))  Basic metabolic panel     Status: Abnormal   Collection Time: 10/01/20  3:28 PM  Result Value Ref Range   Sodium 134 (L) 135 - 145 mmol/L   Potassium 3.8 3.5 - 5.1 mmol/L   Chloride 100 98 - 111 mmol/L   CO2 24 22 - 32 mmol/L   Glucose, Bld 79 70 - 99 mg/dL    Comment: Glucose reference range applies only to samples taken after fasting for at least 8 hours.   BUN 7 (L) 8 - 23 mg/dL   Creatinine, Ser 0.17 0.44 - 1.00 mg/dL   Calcium 8.8 (L) 8.9 - 10.3 mg/dL   GFR, Estimated >49 >44 mL/min    Comment: (NOTE) Calculated using the CKD-EPI Creatinine Equation (2021)    Anion gap 10 5 - 15    Comment: Performed at [facility], [address], [place], [place] [address]   No results found.  Review of Systems  Cardiovascular: Negative.   Endocrine: Negative.   Genitourinary: Negative.    Blood pressure (!) 149/96, pulse 65, temperature 98.3 F (36.8 C), temperature source Oral, resp. rate 18, height 5\' 2"  (1.575 m), weight 58.1 kg, SpO2 100 %. Physical Exam  Fracture left wrist with a displaced distal radius component and median nerve contusion with likely underlying carpal tunnel phenomenon secondary to timeframe duration from injury to presentation.  We will plan for surgical decompression and repair of the bony phenomenon/fracture.  Right thumb has significant MCP swelling and pain we will plan for evaluation under anesthesia and repair is necessary.  Abdomen is nontender chest is clear she does have rib fractures but has  good air expansion and no shortness of breath or wheeze.  Lower extremity examination is stable.  She has ecchymosis about her left shoulder with negative radiographs. Assessment/Plan We will plan for ORIF left wrist with brachial radialis tenotomy and carpal tunnel release.  We will plan for evaluation under anesthesia right thumb MCP joint and ulnar collateral ligament repair if necessary.  I have reviewed these issues with patient at length and the relevant findings.  We are planning surgery for your upper extremity. The risk and benefits of surgery to include risk of bleeding, infection, anesthesia,  damage to normal structures and failure of the surgery to  accomplish its intended goals of relieving symptoms and restoring function have been discussed in detail. With this in mind we plan to proceed. I have specifically discussed with the patient the pre-and postoperative regime and the dos and don'ts and risk and benefits in great detail. Risk and benefits of surgery also include risk of dystrophy(CRPS), chronic nerve pain, failure of the healing process to go onto completion and other inherent risks of surgery The relavent the pathophysiology of the disease/injury process, as well as the alternatives for treatment and postoperative course of action has been discussed in great detail with the patient who desires to proceed.  We will do everything in our power to help you (the patient) restore function to the upper extremity. It is a pleasure to see this patient today.   Oletta Cohn III, MD 10/01/2020, 6:16 PM

## 2020-10-01 NOTE — Anesthesia Preprocedure Evaluation (Addendum)
Anesthesia Evaluation  Patient identified by MRN, date of birth, ID band Patient awake    Reviewed: Allergy & Precautions, NPO status , Patient's Chart, lab work & pertinent test results  Airway Mallampati: II  TM Distance: >3 FB Neck ROM: Full    Dental no notable dental hx.    Pulmonary former smoker,    Pulmonary exam normal breath sounds clear to auscultation       Cardiovascular hypertension, Pt. on medications Normal cardiovascular exam Rhythm:Regular Rate:Normal     Neuro/Psych PSYCHIATRIC DISORDERS Anxiety Depression negative neurological ROS     GI/Hepatic negative GI ROS, Neg liver ROS,   Endo/Other  negative endocrine ROS  Renal/GU negative Renal ROS     Musculoskeletal  (+) Arthritis ,   Abdominal   Peds  Hematology HLD   Anesthesia Other Findings Displaced left distal radius fracture with paresthesias.   Right thumb ulnar collateral ligament injury.  Reproductive/Obstetrics                            Anesthesia Physical Anesthesia Plan  ASA: 2  Anesthesia Plan: General   Post-op Pain Management:    Induction: Intravenous  PONV Risk Score and Plan: 3 and Ondansetron, Dexamethasone, Midazolam and Treatment may vary due to age or medical condition  Airway Management Planned: LMA  Additional Equipment:   Intra-op Plan:   Post-operative Plan: Extubation in OR  Informed Consent: I have reviewed the patients History and Physical, chart, labs and discussed the procedure including the risks, benefits and alternatives for the proposed anesthesia with the patient or authorized representative who has indicated his/her understanding and acceptance.     Dental advisory given  Plan Discussed with: CRNA  Anesthesia Plan Comments: (Potential post op regional anesthesia discussed)        Anesthesia Quick Evaluation

## 2020-10-01 NOTE — Op Note (Signed)
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  This was a greater than 3 part intra-articular fracture.  #2 AP lateral and oblique x-rays performed examined and interpreted by myself #3 brachioradialis tenotomy left wrist #4 open left carpal tunnel release #5 evaluation under anesthesia right thumb MCP joint which was stable  Rheba Diamond MD  Anesthesia: General  Estimated blood loss minimal  Complications none immediate  Operative indications the patient presents for evaluation and surgical care.  Patient understands risk benefits and desires to proceed.  We have discussed with the patient all issues plans and concerns with this in mind we will proceed accordingly. We are planning surgery for your upper extremity. The risk and benefits of surgery to include risk of bleeding, infection, anesthesia,  damage to normal structures and failure of the surgery to accomplish its intended goals of relieving symptoms and restoring function have been discussed in detail. With this in mind we plan to proceed. I have specifically discussed with the patient the pre-and postoperative regime and the dos and don'ts and risk and benefits in great detail. Risk and benefits of surgery also include risk of dystrophy(CRPS), chronic nerve pain, failure of the healing process to go onto completion and other inherent risks of surgery The relavent the pathophysiology of the disease/injury process, as well as the alternatives for treatment and postoperative course of action has been discussed in great detail with the patient who desires to  proceed.  We will do everything in our power to help you (the patient) restore function to the upper extremity. It is a pleasure to see this patient today.    Operative procedure: Patient was seen by myself and anesthesia.  Appropriate anesthesia was induced and following this the patient was prepped with a Hibiclens pre-scrub followed by 10-minute surgical Betadine scrub and paint.  Once this was completed the extremity was elevated and the tourniquet was insufflated to 250 mmHg.  Timeout was observed preoperative antibiotics were given and the patient then underwent a very careful and cautious approach to the extremity with volar radial incision under 250 mm tourniquet control.  FCR tendon sheath was identified and dissected.  There were no complicating features.  Once this was completed the carpal canal contents were retracted ulnarly and the FCR was retracted radially.  We took very meticulous care of the radial artery and the carpal canal contents during the approach.  At this time I performed a brachial radialis tenotomy to lessen the deforming forces of the tendon and allow for styloid reduction.  This allowed for reduction of the greater than 3 part intra-articular fracture without difficulty.  The pronator was accessed incised and lifted off of the fracture.  The fracture was then reassembled with standard orthopedic equipment and a DVR plate and screw construct from [employer] was accomplished in terms of placement and fixation of the fracture. This was a [employer] standard volar rim plate Adequate radial height, volar tilt and radial inclination was restored.  The distal radial ulnar joint, radiocarpal and midcarpal joints all were  stable and satisfactory.   Following this we turned attention towards the carpal tunnel a 1 inch incision was made dissection  was carried down palmar fascia was released distal edge was released under 4.5 loupe magnification.  Fat pad aggressive nicely.  Distal to  proximal dissection was carried out to allow for release proximally including the proximal leaflet and the antebrachial fascia.  This was a complete carpal tunnel release and there were no complicating features.  Fat pad aggressive nicely distally in the superficial palmar arch was protected.  Median nerve was hyperemic and intact.   We irrigated copiously and closed the pronator with 3-0 Vicryl followed by closure of the skin edge with Prolene at the volar radial incision.  The carpal tunnel incision was closed with simple 4-0 Prolene.  Once again, the distal radius underwent open reduction internal fixation without complications.  The distal radial ulnar joint was stable.  The patient had no complications.  All radiographic parameters look quite well following the fixation.  Standard dressing of Adaptic Xeroform 4 x 4's gauze web roll Kerlix and a volar splint were applied. Following this I turned attention towards the right thumb I examined the thumb in detail.  Stress testing revealed no ulnar collateral ligament insufficiency about the MCP joint.  The patient had marked ecchymosis and contusive injury about the thumb but there is no ligamentous insufficiency to warrant surgical reconstruction.  We will continue immobilization and observation of this area of course. The patient understands instructions of elevate move massage fingers notify us any problems occur and follow-up care according to our standard protocol for a DVR plate and screw construct.  He has been a pleasure participate in the patient's care and we look forward to spent in the patient's recovery.  Dominica Severin MD

## 2020-10-02 ENCOUNTER — Encounter (HOSPITAL_COMMUNITY): Payer: Self-pay | Admitting: Orthopedic Surgery

## 2020-10-07 DIAGNOSIS — E782 Mixed hyperlipidemia: Secondary | ICD-10-CM | POA: Diagnosis not present

## 2020-10-14 DIAGNOSIS — E782 Mixed hyperlipidemia: Secondary | ICD-10-CM | POA: Diagnosis not present

## 2020-10-14 DIAGNOSIS — I10 Essential (primary) hypertension: Secondary | ICD-10-CM | POA: Diagnosis not present

## 2020-10-16 DIAGNOSIS — M79644 Pain in right finger(s): Secondary | ICD-10-CM | POA: Diagnosis not present

## 2020-10-16 DIAGNOSIS — S52502A Unspecified fracture of the lower end of left radius, initial encounter for closed fracture: Secondary | ICD-10-CM | POA: Diagnosis not present

## 2020-10-28 DIAGNOSIS — H25012 Cortical age-related cataract, left eye: Secondary | ICD-10-CM | POA: Diagnosis not present

## 2020-10-28 DIAGNOSIS — H2512 Age-related nuclear cataract, left eye: Secondary | ICD-10-CM | POA: Diagnosis not present

## 2020-10-29 DIAGNOSIS — S52502A Unspecified fracture of the lower end of left radius, initial encounter for closed fracture: Secondary | ICD-10-CM | POA: Diagnosis not present

## 2020-10-29 DIAGNOSIS — M25632 Stiffness of left wrist, not elsewhere classified: Secondary | ICD-10-CM | POA: Diagnosis not present

## 2020-11-09 DIAGNOSIS — M25632 Stiffness of left wrist, not elsewhere classified: Secondary | ICD-10-CM | POA: Diagnosis not present

## 2020-12-09 ENCOUNTER — Other Ambulatory Visit: Payer: Self-pay | Admitting: Internal Medicine

## 2020-12-09 DIAGNOSIS — Z1231 Encounter for screening mammogram for malignant neoplasm of breast: Secondary | ICD-10-CM

## 2021-01-15 ENCOUNTER — Ambulatory Visit: Payer: Medicare Other

## 2021-02-04 DIAGNOSIS — S52502A Unspecified fracture of the lower end of left radius, initial encounter for closed fracture: Secondary | ICD-10-CM | POA: Diagnosis not present

## 2021-02-04 DIAGNOSIS — M79644 Pain in right finger(s): Secondary | ICD-10-CM | POA: Diagnosis not present

## 2021-02-04 DIAGNOSIS — M13849 Other specified arthritis, unspecified hand: Secondary | ICD-10-CM | POA: Diagnosis not present

## 2021-02-04 DIAGNOSIS — M79632 Pain in left forearm: Secondary | ICD-10-CM | POA: Diagnosis not present

## 2021-03-19 ENCOUNTER — Emergency Department (HOSPITAL_COMMUNITY)
Admission: EM | Admit: 2021-03-19 | Discharge: 2021-03-20 | Disposition: A | Discharge status: Home / Self Care | Payer: Medicare Other | Attending: Emergency Medicine | Admitting: Emergency Medicine

## 2021-03-19 ENCOUNTER — Encounter (HOSPITAL_COMMUNITY): Payer: Self-pay | Admitting: Emergency Medicine

## 2021-03-19 DIAGNOSIS — Y908 Blood alcohol level of 240 mg/100 ml or more: Secondary | ICD-10-CM | POA: Insufficient documentation

## 2021-03-19 DIAGNOSIS — R4020 Unspecified coma: Secondary | ICD-10-CM | POA: Diagnosis not present

## 2021-03-19 DIAGNOSIS — I10 Essential (primary) hypertension: Secondary | ICD-10-CM | POA: Insufficient documentation

## 2021-03-19 DIAGNOSIS — F10921 Alcohol use, unspecified with intoxication delirium: Secondary | ICD-10-CM

## 2021-03-19 DIAGNOSIS — Z87891 Personal history of nicotine dependence: Secondary | ICD-10-CM | POA: Insufficient documentation

## 2021-03-19 DIAGNOSIS — F10129 Alcohol abuse with intoxication, unspecified: Secondary | ICD-10-CM | POA: Diagnosis present

## 2021-03-19 DIAGNOSIS — Z7982 Long term (current) use of aspirin: Secondary | ICD-10-CM | POA: Insufficient documentation

## 2021-03-19 DIAGNOSIS — Z743 Need for continuous supervision: Secondary | ICD-10-CM | POA: Diagnosis not present

## 2021-03-19 DIAGNOSIS — F10121 Alcohol abuse with intoxication delirium: Secondary | ICD-10-CM | POA: Insufficient documentation

## 2021-03-19 DIAGNOSIS — Z79899 Other long term (current) drug therapy: Secondary | ICD-10-CM | POA: Diagnosis not present

## 2021-03-19 LAB — COMPREHENSIVE METABOLIC PANEL
ALT: 20 U/L (ref 0–44)
AST: 30 U/L (ref 15–41)
Albumin: 4.3 g/dL (ref 3.5–5.0)
Alkaline Phosphatase: 53 U/L (ref 38–126)
Anion gap: 12 (ref 5–15)
BUN: 7 mg/dL — ABNORMAL LOW (ref 8–23)
CO2: 22 mmol/L (ref 22–32)
Calcium: 8.5 mg/dL — ABNORMAL LOW (ref 8.9–10.3)
Chloride: 106 mmol/L (ref 98–111)
Creatinine, Ser: 0.51 mg/dL (ref 0.44–1.00)
GFR, Estimated: >60 mL/min (ref >60)
Glucose, Bld: 92 mg/dL (ref 70–99)
Potassium: 3.8 mmol/L (ref 3.5–5.1)
Sodium: 140 mmol/L (ref 135–145)
Total Bilirubin: 0.4 mg/dL (ref 0.3–1.2)
Total Protein: 7.8 g/dL (ref 6.5–8.1)

## 2021-03-19 LAB — URINALYSIS, ROUTINE W REFLEX MICROSCOPIC
Bilirubin Urine: NEGATIVE
Glucose, UA: NEGATIVE mg/dL
Hgb urine dipstick: NEGATIVE
Ketones, ur: NEGATIVE mg/dL
Leukocytes,Ua: NEGATIVE
Nitrite: NEGATIVE
Protein, ur: NEGATIVE mg/dL
Specific Gravity, Urine: <1.005 — ABNORMAL LOW (ref 1.005–1.030)
pH: 6 (ref 5.0–8.0)

## 2021-03-19 LAB — RAPID URINE DRUG SCREEN, HOSP PERFORMED
Amphetamines: NOT DETECTED
Barbiturates: NOT DETECTED
Benzodiazepines: NOT DETECTED
Cocaine: NOT DETECTED
Opiates: NOT DETECTED
Tetrahydrocannabinol: NOT DETECTED

## 2021-03-19 LAB — CBC WITH DIFFERENTIAL/PLATELET
Abs Immature Granulocytes: 0.02 10*3/uL (ref 0.00–0.07)
Basophils Absolute: 0.1 10*3/uL (ref 0.0–0.1)
Basophils Relative: 1 %
Eosinophils Absolute: 0.2 10*3/uL (ref 0.0–0.5)
Eosinophils Relative: 3 %
HCT: 41.8 % (ref 36.0–46.0)
Hemoglobin: 13.7 g/dL (ref 12.0–15.0)
Immature Granulocytes: 0 %
Lymphocytes Relative: 33 %
Lymphs Abs: 2.4 10*3/uL (ref 0.7–4.0)
MCH: 30.8 pg (ref 26.0–34.0)
MCHC: 32.8 g/dL (ref 30.0–36.0)
MCV: 93.9 fL (ref 80.0–100.0)
Monocytes Absolute: 1.2 10*3/uL — ABNORMAL HIGH (ref 0.1–1.0)
Monocytes Relative: 17 %
Neutro Abs: 3.3 10*3/uL (ref 1.7–7.7)
Neutrophils Relative %: 46 %
Platelets: 273 10*3/uL (ref 150–400)
RBC: 4.45 MIL/uL (ref 3.87–5.11)
RDW: 14.4 % (ref 11.5–15.5)
WBC: 7.2 10*3/uL (ref 4.0–10.5)
nRBC: 0 % (ref 0.0–0.2)

## 2021-03-19 LAB — ETHANOL: Alcohol, Ethyl (B): 429 mg/dL (ref <10)

## 2021-03-19 LAB — TROPONIN I (HIGH SENSITIVITY): Troponin I (High Sensitivity): 3 ng/L (ref <18)

## 2021-03-19 MED ORDER — HALOPERIDOL LACTATE 5 MG/ML IJ SOLN
2.0000 mg | Freq: Four times a day (QID) | INTRAMUSCULAR | Status: DC | PRN
  Filled 2021-03-19 (×2): qty 1

## 2021-03-19 MED ORDER — HALOPERIDOL LACTATE 5 MG/ML IJ SOLN
5.0000 mg | Freq: Once | INTRAMUSCULAR | Status: AC
  Administered 2021-03-19: 5 mg via INTRAMUSCULAR

## 2021-03-19 NOTE — Discharge Instructions (Signed)
Drink less.  Follow-up with your doctors or with the resources given if you would like further treatment.
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
## 2021-03-19 NOTE — ED Notes (Signed)
Patient ambulatory to room door and back to bed with no assistance

## 2021-03-19 NOTE — ED Notes (Signed)
Patient bag placed in belongings cabinet with shoes.

## 2021-03-19 NOTE — ED Provider Notes (Signed)
[facility] DEPT Provider Note   CSN: [id number] Arrival date & time: 03/19/21  1722     History Chief Complaint  Patient presents with   Alcohol Intoxication   Aggressive Behavior    Amanda [name] is a 70 y.o. female. Level 5 caveat due to altered mental status.  Alcohol Intoxication Patient reportedly brought in for alcohol intoxication.  Reportedly found unresponsive in car.  Has been combative and yelling.  However for me she will not speak.  Appears to moving all extremities but will not answer questions.    Past Medical History:  Diagnosis Date   Anxiety    Arthritis    Bradycardia 12/18/2016   Chest pain 12/18/2016   patient denies   Depression    Dyspnea    Ear infection    history   Ectopic pregnancy    Essential hypertension 01/02/2017   new diagnosiss taking lisinopril   History of blood transfusion 1981   etopic pregnancy    Hyperlipidemia 12/18/2016   Hypertension    Lightheadedness 0000000   Umbilical hernia     Patient Active Problem List   Diagnosis Date Noted   Essential hypertension 01/02/2017   Hyperlipidemia 12/18/2016   Chest pain 12/18/2016   Lightheadedness 12/18/2016   Bradycardia 12/18/2016   Alcohol dependence ([place]) 06/13/2012   Alcohol withdrawal ([place]) 06/13/2012   Major depression 06/13/2012   Generalized anxiety disorder 06/13/2012    Past Surgical History:  Procedure Laterality Date   ABDOMINAL HYSTERECTOMY     AUGMENTATION MAMMAPLASTY Bilateral    BREAST SURGERY     CHOLECYSTECTOMY  2002   colonscopy  11/2002   DILATION AND CURETTAGE OF UTERUS  09/2007   hysteroscopy for fibroid   HEMORRHOID SURGERY  08/2004   LAPAROSCOPIC ASSISTED VAGINAL HYSTERECTOMY  12/07/2011   Procedure: LAPAROSCOPIC ASSISTED VAGINAL HYSTERECTOMY;  Surgeon: Thurnell Lose, MD;  Location: [facility];  Service: Gynecology;  Laterality: N/A;   OPEN REDUCTION INTERNAL FIXATION (ORIF) DISTAL RADIAL FRACTURE Left 10/01/2020    Procedure: Open reduction internal fixation left distal radius fracture with brachioradialis tenotomy.  Left carpal tunnel release.  Right thumb evaluation under anesthesia.;  Surgeon: Roseanne Kaufman, MD;  Location: [facility];  Service: Orthopedics;  Laterality: Left;  90 mins   RADIOACTIVE SEED GUIDED EXCISIONAL BREAST BIOPSY Right 01/11/2017   Procedure: RADIOACTIVE SEED GUIDED EXCISIONAL BREAST BIOPSY;  Surgeon: Rolm Bookbinder, MD;  Location: [facility];  Service: General;  Laterality: Right;   UPPER GASTROINTESTINAL ENDOSCOPY  11/2002     OB History   No obstetric history on file.     Family History  Problem Relation Age of Onset   Hyperlipidemia Mother    Crohn's disease Mother    Dementia Mother    Hyperlipidemia Father    CAD Father    Hyperlipidemia Sister    Hypertension Sister    Hypertension Brother    Hyperlipidemia Brother    Hyperlipidemia Daughter    CVA Maternal Grandmother    CVA Maternal Grandfather    Stomach cancer Paternal Grandmother     Social History   Tobacco Use   Smoking status: Former   Smokeless tobacco: Never   Tobacco comments:    "smoked for 10 years but stopped a long time ago"  Scientific laboratory technician Use: Never used  Substance Use Topics   Alcohol use: Yes    Alcohol/week: 4.0 standard drinks    Types: 4 Glasses of wine per week   Drug use:  No    Home Medications Prior to Admission medications   Medication Sig Start Date End Date Taking? Authorizing Provider  Ascorbic Acid (VITAMIN C) 500 MG CHEW Chew 1 tablet by mouth at bedtime.    [provider]  aspirin EC 81 MG tablet Take 81 mg by mouth at bedtime.    [provider]  Calcium Carb-Cholecalciferol (CALCIUM 1000 + D PO) Take 1 tablet by mouth at bedtime.    [provider]  cephALEXin (KEFLEX) 500 MG capsule Take 500 mg by mouth 4 (four) times daily. 09/30/20   [provider]  cyclobenzaprine (FLEXERIL) 10 MG tablet Take 10 mg by mouth 3 (three)  times daily. 09/23/20   [provider]  escitalopram (LEXAPRO) 20 MG tablet Take 20 mg by mouth in the morning. 09/11/20   [provider]  glucosamine-chondroitin 500-400 MG tablet Take 1 tablet by mouth in the morning.    [provider]  HYDROcodone-acetaminophen (NORCO/VICODIN) 5-325 MG tablet Take 1 tablet by mouth every 6 (six) hours as needed for moderate pain or severe pain. 09/23/20   [provider]  lisinopril (ZESTRIL) 10 MG tablet Take 10 mg by mouth in the morning.    [provider]  methocarbamol (ROBAXIN) 500 MG tablet Take 1,000 mg by mouth 2 (two) times daily. 09/30/20   [provider]  Multiple Vitamin (MULTIVITAMIN WITH MINERALS) TABS tablet Take 1 tablet by mouth daily.    [provider]  Omega-3 Fatty Acids (FISH OIL) 500 MG CAPS Take 1 capsule by mouth at bedtime.    [provider]  oxyCODONE (OXY IR/ROXICODONE) 5 MG immediate release tablet Take 5 mg by mouth every 6 (six) hours as needed for moderate pain or severe pain. 09/30/20   [provider]  rosuvastatin (CRESTOR) 5 MG tablet TAKE ONE TABLET BY MOUTH ON MONDAYS AND FRIDAYS AS DIRECTED Patient taking differently: Take 5 mg by mouth every morning. 01/19/18   Chilton Siandolph, Tiffany, MD    Allergies    Codeine and Medrol [methylprednisolone]  Review of Systems   Review of Systems  Unable to perform ROS: Mental status change   Physical Exam Updated Vital Signs BP (!) 173/67 Comment: Pt moving arm attempting to take off cuff   Pulse 73    Temp 98 F (36.7 C) (Oral)    Resp 16    SpO2 97%   Physical Exam Vitals and nursing note reviewed.  Constitutional:      Appearance: Normal appearance.  HENT:     Head: Atraumatic.  Eyes:     Pupils: Pupils are equal, round, and reactive to light.  Cardiovascular:     Rate and Rhythm: Regular rhythm.  Pulmonary:     Breath sounds: No wheezing or rhonchi.  Abdominal:     Tenderness: There is no  abdominal tenderness.  Musculoskeletal:        General: No tenderness.     Cervical back: No tenderness.  Skin:    General: Skin is warm.     Capillary Refill: Capillary refill takes less than 2 seconds.  Neurological:     Comments: Awake and moving all extremities.  Will speak a little bit but really will not speak to me and will follow some commands but not many    ED Results / Procedures / Treatments   Labs (all labs ordered are listed, but only abnormal results are displayed) Labs Reviewed  ETHANOL - Abnormal; Notable for the following components:  Result Value   Alcohol, Ethyl (B) 429 (*)    All other components within normal limits  COMPREHENSIVE METABOLIC PANEL - Abnormal; Notable for the following components:   BUN 7 (*)    Calcium 8.5 (*)    All other components within normal limits  CBC WITH DIFFERENTIAL/PLATELET - Abnormal; Notable for the following components:   Monocytes Absolute 1.2 (*)    All other components within normal limits  URINALYSIS, ROUTINE W REFLEX MICROSCOPIC - Abnormal; Notable for the following components:   Specific Gravity, Urine <1.005 (*)    All other components within normal limits  RAPID URINE DRUG SCREEN, HOSP PERFORMED  TROPONIN I (HIGH SENSITIVITY)    EKG EKG Interpretation  Date/Time:  Friday March 19 2021 17:56:35 EST Ventricular Rate:  68 PR Interval:  60 QRS Duration: 103 QT Interval:  413 QTC Calculation: 440 R Axis:   84 Text Interpretation: Sinus rhythm Short PR interval Borderline right axis deviation Confirmed by Benjiman Core [phone]) on 03/19/2021 6:42:13 PM  Radiology No results found.  Procedures Procedures   Medications Ordered in ED Medications  haloperidol lactate (HALDOL) injection 5 mg (5 mg Intramuscular Given 03/19/21 1842)    ED Course  I have reviewed the triage vital signs and the nursing notes.  Pertinent labs & imaging results that were available during my care of the patient were  reviewed by me and considered in my medical decision making (see chart for details).    MDM Rules/Calculators/A&P                         Patient brought in reportedly found unresponsive.  Initially could not provide much history and became belligerent and somewhat violent.  Haldol was given.  Labs reassuring except for a very elevated alcohol.  Has had an improvement in her mental status now on recheck after being here for over 6 hours.  Awake and more pleasant.  Appears stable for discharge home.  Resources given for follow-up.  Will discharge home.    Final Clinical Impression(s) / ED Diagnoses Final diagnoses:  Alcohol intoxication with delirium [facility])    Rx / DC Orders ED Discharge Orders     None        Benjiman Core, MD 03/19/21 2344

## 2021-03-19 NOTE — ED Notes (Signed)
Critical ETOH 429 MD notified.
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
## 2021-03-20 NOTE — ED Notes (Signed)
Went to introduce self to patient. She is resting at this time. Patient is up to be discharged, but needs to metabolize her alcohol further before she can go safely on her own.

## 2021-03-20 NOTE — ED Notes (Signed)
Pt declined discharge vitals. She called a taxi and ambulated out of the ED with a steady gait, her phone, clothing, and purse.

## 2021-03-20 NOTE — ED Notes (Signed)
Pt woke requesting water.

## 2021-03-20 NOTE — ED Notes (Signed)
Pt awake. Pt calling for a ride. She is refusing vitals at this time.

## 2021-05-18 ENCOUNTER — Ambulatory Visit: Payer: Medicare Other

## 2021-05-18 ENCOUNTER — Other Ambulatory Visit: Payer: Self-pay

## 2021-05-18 ENCOUNTER — Ambulatory Visit
Admission: RE | Admit: 2021-05-18 | Discharge: 2021-05-18 | Disposition: A | Discharge status: Home / Self Care | Payer: Medicare Other | Source: Ambulatory Visit | Attending: Internal Medicine | Admitting: Internal Medicine

## 2021-05-18 DIAGNOSIS — Z1231 Encounter for screening mammogram for malignant neoplasm of breast: Secondary | ICD-10-CM

## 2021-05-28 ENCOUNTER — Emergency Department (HOSPITAL_COMMUNITY)
Admission: EM | Admit: 2021-05-28 | Discharge: 2021-05-28 | Disposition: A | Discharge status: Home / Self Care | Payer: Medicare Other | Attending: Emergency Medicine | Admitting: Emergency Medicine

## 2021-05-28 ENCOUNTER — Emergency Department (HOSPITAL_COMMUNITY): Payer: Medicare Other

## 2021-05-28 DIAGNOSIS — R4689 Other symptoms and signs involving appearance and behavior: Secondary | ICD-10-CM | POA: Diagnosis present

## 2021-05-28 DIAGNOSIS — S0083XA Contusion of other part of head, initial encounter: Secondary | ICD-10-CM | POA: Diagnosis not present

## 2021-05-28 DIAGNOSIS — Z79899 Other long term (current) drug therapy: Secondary | ICD-10-CM | POA: Insufficient documentation

## 2021-05-28 DIAGNOSIS — Y907 Blood alcohol level of 200-239 mg/100 ml: Secondary | ICD-10-CM | POA: Diagnosis not present

## 2021-05-28 DIAGNOSIS — R4182 Altered mental status, unspecified: Secondary | ICD-10-CM | POA: Diagnosis not present

## 2021-05-28 DIAGNOSIS — F10920 Alcohol use, unspecified with intoxication, uncomplicated: Secondary | ICD-10-CM

## 2021-05-28 DIAGNOSIS — F10120 Alcohol abuse with intoxication, uncomplicated: Secondary | ICD-10-CM | POA: Insufficient documentation

## 2021-05-28 DIAGNOSIS — I10 Essential (primary) hypertension: Secondary | ICD-10-CM | POA: Diagnosis not present

## 2021-05-28 DIAGNOSIS — Z743 Need for continuous supervision: Secondary | ICD-10-CM | POA: Diagnosis not present

## 2021-05-28 DIAGNOSIS — I517 Cardiomegaly: Secondary | ICD-10-CM | POA: Diagnosis not present

## 2021-05-28 DIAGNOSIS — R918 Other nonspecific abnormal finding of lung field: Secondary | ICD-10-CM | POA: Diagnosis not present

## 2021-05-28 DIAGNOSIS — X58XXXA Exposure to other specified factors, initial encounter: Secondary | ICD-10-CM | POA: Diagnosis not present

## 2021-05-28 DIAGNOSIS — S0081XA Abrasion of other part of head, initial encounter: Secondary | ICD-10-CM | POA: Insufficient documentation

## 2021-05-28 DIAGNOSIS — G319 Degenerative disease of nervous system, unspecified: Secondary | ICD-10-CM | POA: Diagnosis not present

## 2021-05-28 LAB — CBC WITH DIFFERENTIAL/PLATELET
Abs Immature Granulocytes: 0.05 10*3/uL (ref 0.00–0.07)
Basophils Absolute: 0 10*3/uL (ref 0.0–0.1)
Basophils Relative: 1 %
Eosinophils Absolute: 0 10*3/uL (ref 0.0–0.5)
Eosinophils Relative: 1 %
HCT: 43 % (ref 36.0–46.0)
Hemoglobin: 14.3 g/dL (ref 12.0–15.0)
Immature Granulocytes: 1 %
Lymphocytes Relative: 24 %
Lymphs Abs: 1.5 10*3/uL (ref 0.7–4.0)
MCH: 29.9 pg (ref 26.0–34.0)
MCHC: 33.3 g/dL (ref 30.0–36.0)
MCV: 90 fL (ref 80.0–100.0)
Monocytes Absolute: 0.9 10*3/uL (ref 0.1–1.0)
Monocytes Relative: 13 %
Neutro Abs: 3.8 10*3/uL (ref 1.7–7.7)
Neutrophils Relative %: 60 %
Platelets: 301 10*3/uL (ref 150–400)
RBC: 4.78 MIL/uL (ref 3.87–5.11)
RDW: 13.6 % (ref 11.5–15.5)
WBC: 6.3 10*3/uL (ref 4.0–10.5)
nRBC: 0 % (ref 0.0–0.2)

## 2021-05-28 LAB — COMPREHENSIVE METABOLIC PANEL
ALT: 23 U/L (ref 0–44)
AST: 35 U/L (ref 15–41)
Albumin: 4.1 g/dL (ref 3.5–5.0)
Alkaline Phosphatase: 58 U/L (ref 38–126)
Anion gap: 12 (ref 5–15)
BUN: <5 mg/dL — ABNORMAL LOW (ref 8–23)
CO2: 25 mmol/L (ref 22–32)
Calcium: 9 mg/dL (ref 8.9–10.3)
Chloride: 106 mmol/L (ref 98–111)
Creatinine, Ser: 0.54 mg/dL (ref 0.44–1.00)
GFR, Estimated: >60 mL/min (ref >60)
Glucose, Bld: 105 mg/dL — ABNORMAL HIGH (ref 70–99)
Potassium: 3.9 mmol/L (ref 3.5–5.1)
Sodium: 143 mmol/L (ref 135–145)
Total Bilirubin: 0.2 mg/dL — ABNORMAL LOW (ref 0.3–1.2)
Total Protein: 7.6 g/dL (ref 6.5–8.1)

## 2021-05-28 LAB — BRAIN NATRIURETIC PEPTIDE: B Natriuretic Peptide: 40.7 pg/mL (ref 0.0–100.0)

## 2021-05-28 LAB — ETHANOL: Alcohol, Ethyl (B): 229 mg/dL — ABNORMAL HIGH (ref <10)

## 2021-05-28 MED ORDER — DROPERIDOL 2.5 MG/ML IJ SOLN
5.0000 mg | Freq: Once | INTRAMUSCULAR | Status: AC
  Administered 2021-05-28: 5 mg via INTRAMUSCULAR
  Filled 2021-05-28: qty 2

## 2021-05-28 MED ORDER — HALOPERIDOL LACTATE 5 MG/ML IJ SOLN
5.0000 mg | INTRAMUSCULAR | Status: AC
  Administered 2021-05-28: 5 mg via INTRAMUSCULAR
  Filled 2021-05-28: qty 1

## 2021-05-28 NOTE — ED Notes (Signed)
Pt attempted walking out again. EDP made aware. Haldol ordered.
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
## 2021-05-28 NOTE — ED Notes (Signed)
Call pt's friend at [phone] Sherrine Maples) to come to pick up the pt when she's up for dc .
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
## 2021-05-28 NOTE — ED Notes (Signed)
Pt is spitting at the staff, also being aggressive towards the staff.  Pt refused to be changed to hospital gown, blood work and EKG. EDP made aware.

## 2021-05-28 NOTE — ED Notes (Signed)
Pt ambulating with out issue. Requesting to be discharged.

## 2021-05-28 NOTE — Progress Notes (Signed)
Transition of Care [facility]) - Emergency Department Mini Assessment   Patient Details  Name: Amanda [name] MRN: [id number] Date of Birth: 11/21/1950  Transition of Care [facility]) CM/SW Contact:    Vergie Living, LCSW Phone Number: 05/28/2021, 8:38 PM   Clinical Narrative:    ED Mini Assessment: What brought you to the Emergency Department? : (P) ETOH  Barriers to Discharge: (P) No Barriers Identified  Barrier interventions: (P) CSW met with Pt at bedside. CSW and Pt discussed ETOH use. CAGE-AID score 3. 12-step resources provided. Pt and CSW discusses Pt's comfirt level with female only 12-step groups. CSW counseled Pt to seek OP therapy and provided education on how to find a therapist. Pt verbalized appreciation.  Means of departure: (P) Car  Interventions which prevented an admission or readmission: Patient counseling, Other (must enter comment) (SUD counselling)    Patient Contact and Communications        ,                 Admission diagnosis:  ETOH Patient Active Problem List   Diagnosis Date Noted   Essential hypertension 01/02/2017   Hyperlipidemia 12/18/2016   Chest pain 12/18/2016   Lightheadedness 12/18/2016   Bradycardia 12/18/2016   Alcohol dependence ([place]) 06/13/2012   Alcohol withdrawal ([place]) 06/13/2012   Major depression 06/13/2012   Generalized anxiety disorder 06/13/2012   PCP:  Merrilee Seashore, MD Pharmacy:   [facility] [id number] [place], [place] - [address] [place] [address] [place] [address] Phone: [phone] Fax: [phone]  [id number] [place] [id number]: [phone]: [phone]
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
## 2021-06-07 DIAGNOSIS — R5383 Other fatigue: Secondary | ICD-10-CM | POA: Diagnosis not present

## 2021-06-07 DIAGNOSIS — D72810 Lymphocytopenia: Secondary | ICD-10-CM | POA: Diagnosis not present

## 2021-06-07 DIAGNOSIS — M810 Age-related osteoporosis without current pathological fracture: Secondary | ICD-10-CM | POA: Diagnosis not present

## 2021-06-07 DIAGNOSIS — I10 Essential (primary) hypertension: Secondary | ICD-10-CM | POA: Diagnosis not present

## 2021-06-07 DIAGNOSIS — Z0000 Encounter for general adult medical examination without abnormal findings: Secondary | ICD-10-CM | POA: Diagnosis not present

## 2021-06-07 DIAGNOSIS — E782 Mixed hyperlipidemia: Secondary | ICD-10-CM | POA: Diagnosis not present

## 2021-06-07 DIAGNOSIS — M8580 Other specified disorders of bone density and structure, unspecified site: Secondary | ICD-10-CM | POA: Diagnosis not present

## 2021-06-14 DIAGNOSIS — Z0000 Encounter for general adult medical examination without abnormal findings: Secondary | ICD-10-CM | POA: Diagnosis not present

## 2021-06-14 DIAGNOSIS — E782 Mixed hyperlipidemia: Secondary | ICD-10-CM | POA: Diagnosis not present

## 2021-06-14 DIAGNOSIS — L129 Pemphigoid, unspecified: Secondary | ICD-10-CM | POA: Diagnosis not present

## 2021-06-14 DIAGNOSIS — M8580 Other specified disorders of bone density and structure, unspecified site: Secondary | ICD-10-CM | POA: Diagnosis not present

## 2021-06-14 DIAGNOSIS — I10 Essential (primary) hypertension: Secondary | ICD-10-CM | POA: Diagnosis not present

## 2021-06-29 DIAGNOSIS — R197 Diarrhea, unspecified: Secondary | ICD-10-CM | POA: Diagnosis not present

## 2021-06-29 DIAGNOSIS — R109 Unspecified abdominal pain: Secondary | ICD-10-CM | POA: Diagnosis not present

## 2021-06-30 DIAGNOSIS — D709 Neutropenia, unspecified: Secondary | ICD-10-CM | POA: Diagnosis not present

## 2021-07-13 ENCOUNTER — Emergency Department (HOSPITAL_COMMUNITY)
Admission: EM | Admit: 2021-07-13 | Discharge: 2021-07-13 | Disposition: A | Discharge status: Home / Self Care | Payer: Medicare Other | Attending: Emergency Medicine | Admitting: Emergency Medicine

## 2021-07-13 ENCOUNTER — Encounter (HOSPITAL_COMMUNITY): Payer: Self-pay | Admitting: Emergency Medicine

## 2021-07-13 DIAGNOSIS — F10229 Alcohol dependence with intoxication, unspecified: Secondary | ICD-10-CM | POA: Diagnosis present

## 2021-07-13 DIAGNOSIS — Z5321 Procedure and treatment not carried out due to patient leaving prior to being seen by health care provider: Secondary | ICD-10-CM | POA: Diagnosis not present

## 2021-07-13 DIAGNOSIS — R404 Transient alteration of awareness: Secondary | ICD-10-CM | POA: Diagnosis not present

## 2021-07-13 DIAGNOSIS — R000 Tachycardia, unspecified: Secondary | ICD-10-CM | POA: Diagnosis not present

## 2021-07-13 NOTE — ED Notes (Signed)
Pt is sleeping in the floor. Pt told this RN that she did not want to stand up or get in the chair. No distress noted from patient, beyond her refusal to get up off of the floor.  ?
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
## 2021-07-20 DIAGNOSIS — Z01812 Encounter for preprocedural laboratory examination: Secondary | ICD-10-CM | POA: Diagnosis not present

## 2021-07-20 DIAGNOSIS — R197 Diarrhea, unspecified: Secondary | ICD-10-CM | POA: Diagnosis not present

## 2021-07-20 DIAGNOSIS — R109 Unspecified abdominal pain: Secondary | ICD-10-CM | POA: Diagnosis not present

## 2021-07-20 DIAGNOSIS — Z23 Encounter for immunization: Secondary | ICD-10-CM | POA: Diagnosis not present

## 2021-07-24 DIAGNOSIS — R404 Transient alteration of awareness: Secondary | ICD-10-CM | POA: Diagnosis not present

## 2021-08-02 DIAGNOSIS — 419620001 Death: Secondary | SNOMED CT | POA: Diagnosis not present

## 2021-08-02 DEATH — deceased

## 2021-10-02 DEATH — deceased
